# Patient Record
Sex: Male | Born: 1960 | Race: Black or African American | Hispanic: No | Marital: Married | Smoking: Never smoker
Health system: Southern US, Community
[De-identification: ages and names within clinical notes are randomized; demographics above are authoritative.]

## PROBLEM LIST (undated history)

## (undated) DIAGNOSIS — I639 Cerebral infarction, unspecified: Secondary | ICD-10-CM

## (undated) DIAGNOSIS — R011 Cardiac murmur, unspecified: Secondary | ICD-10-CM

## (undated) DIAGNOSIS — C8410 Sezary disease, unspecified site: Secondary | ICD-10-CM

## (undated) DIAGNOSIS — R9089 Other abnormal findings on diagnostic imaging of central nervous system: Secondary | ICD-10-CM

## (undated) DIAGNOSIS — Z9289 Personal history of other medical treatment: Secondary | ICD-10-CM

## (undated) DIAGNOSIS — J309 Allergic rhinitis, unspecified: Secondary | ICD-10-CM

## (undated) DIAGNOSIS — E785 Hyperlipidemia, unspecified: Secondary | ICD-10-CM

## (undated) DIAGNOSIS — G459 Transient cerebral ischemic attack, unspecified: Secondary | ICD-10-CM

## (undated) DIAGNOSIS — T7840XA Allergy, unspecified, initial encounter: Secondary | ICD-10-CM

## (undated) DIAGNOSIS — C8400 Mycosis fungoides, unspecified site: Secondary | ICD-10-CM

## (undated) HISTORY — DX: Allergy, unspecified, initial encounter: T78.40XA

## (undated) HISTORY — DX: Hyperlipidemia, unspecified: E78.5

## (undated) HISTORY — DX: Transient cerebral ischemic attack, unspecified: G45.9

## (undated) HISTORY — DX: Cerebral infarction, unspecified: I63.9

## (undated) HISTORY — DX: Sezary disease, unspecified site: C84.10

## (undated) HISTORY — DX: Other abnormal findings on diagnostic imaging of central nervous system: R90.89

## (undated) HISTORY — DX: Personal history of other medical treatment: Z92.89

## (undated) HISTORY — PX: WISDOM TOOTH EXTRACTION: SHX21

## (undated) HISTORY — DX: Mycosis fungoides, unspecified site: C84.00

---

## 1993-06-19 DIAGNOSIS — J0300 Acute streptococcal tonsillitis, unspecified: Secondary | ICD-10-CM | POA: Insufficient documentation

## 1996-09-19 DIAGNOSIS — J069 Acute upper respiratory infection, unspecified: Secondary | ICD-10-CM | POA: Insufficient documentation

## 2006-11-28 ENCOUNTER — Ambulatory Visit: Payer: Self-pay | Admitting: Family Medicine

## 2007-01-01 ENCOUNTER — Ambulatory Visit: Payer: Self-pay | Admitting: Family Medicine

## 2007-02-05 ENCOUNTER — Ambulatory Visit: Payer: Self-pay | Admitting: Family Medicine

## 2009-09-05 ENCOUNTER — Ambulatory Visit: Payer: Self-pay | Admitting: Family Medicine

## 2009-10-04 ENCOUNTER — Ambulatory Visit: Payer: Self-pay | Admitting: Family Medicine

## 2010-12-20 ENCOUNTER — Encounter: Payer: Self-pay | Admitting: Family Medicine

## 2010-12-20 DIAGNOSIS — J069 Acute upper respiratory infection, unspecified: Secondary | ICD-10-CM

## 2010-12-20 DIAGNOSIS — J029 Acute pharyngitis, unspecified: Secondary | ICD-10-CM

## 2010-12-20 DIAGNOSIS — J0300 Acute streptococcal tonsillitis, unspecified: Secondary | ICD-10-CM

## 2011-09-09 ENCOUNTER — Ambulatory Visit (INDEPENDENT_AMBULATORY_CARE_PROVIDER_SITE_OTHER): Payer: BC Managed Care – PPO | Admitting: Family Medicine

## 2011-09-09 VITALS — BP 120/80 | HR 96 | Ht 68.0 in | Wt 182.0 lb

## 2011-09-09 DIAGNOSIS — M461 Sacroiliitis, not elsewhere classified: Secondary | ICD-10-CM

## 2011-09-09 NOTE — Progress Notes (Signed)
  Subjective:    Patient ID: Hayden Huang, male    DOB: 11-Oct-1960, 51 y.o.   MRN: [id number]  HPI He complains of a two-month history of intermittent right low back pain that started after he did some wood splitting with a splinter. He has been using ibuprofen and heating pads intermittently. Radiation of pain down the right leg but no weakness, numbness or tingling.   Review of Systems     Objective:   Physical Exam Alert and in no distress. Slight tenderness to palpation over the right upper SI joint area. Good motion of his back. No tenderness to palpation over the vertebral column. Tillman Sers stork test was negative. Negative straight leg raising. Normal hip motion.       Assessment & Plan:  History is consistent with sacroiliitis Recommend heat, stretching exercises as well as Advil 800 3 times a day. Also discussed proper posturing sitting lifting and standing with him. Also discussed also referral to chiropractic or physical therapy.

## 2011-09-09 NOTE — Patient Instructions (Signed)
Heat to your back for 20 minutes 3 times a day. Take 4 Advil 3 times per day. Do this for the next 10-14 days. Accu-Chek done using the heat do the stretching exercises that I showed you.

## 2011-10-31 ENCOUNTER — Encounter: Payer: Self-pay | Admitting: Medical

## 2011-10-31 ENCOUNTER — Ambulatory Visit (INDEPENDENT_AMBULATORY_CARE_PROVIDER_SITE_OTHER): Payer: BC Managed Care – PPO | Admitting: Medical

## 2011-10-31 DIAGNOSIS — J309 Allergic rhinitis, unspecified: Secondary | ICD-10-CM

## 2011-10-31 DIAGNOSIS — J329 Chronic sinusitis, unspecified: Secondary | ICD-10-CM

## 2011-10-31 MED ORDER — BECLOMETHASONE DIPROPIONATE POWD: 2.0000 | Freq: Every day | Status: DC

## 2011-10-31 MED ORDER — AMOXICILLIN 875 MG PO TABS: 875.0000 mg | ORAL_TABLET | Freq: Two times a day (BID) | ORAL | Status: AC

## 2011-10-31 NOTE — Progress Notes (Signed)
Subjective:  Hayden Huang is a 51 y.o. male who presents for 5 day hx/o nasal congestion.  He notes that he normally gets allergy flare ups in the spring, and feels like this is his allergies currently giving him problems.  He reports nasal congestion, itchy eyes, sneezing, and using Sudafed and Neti Pot.  Sudafed is drying him out too much.  He denies sore throat, clough, ear pain, fever, chills, nausea, or vomiting.  No sick contacts.  He does note hx/o sinus infection in the past.  Patient is a non-smoker.  No other aggravating or relieving factors.  No other c/o.  Past Medical History  Diagnosis Date  . Allergy   . Dyslipidemia   . Sezary syndrome    ROS as noted in HPI   Objective:   Filed Vitals:   10/31/11 1615  BP: 130/90  Pulse: 97  Temp: 98.3 F (36.8 C)  Resp: 14    General appearance: Alert, WD/WN, no distress                             Skin: warm, no rash                           Head: no sinus tenderness,                            Eyes: conjunctiva normal, corneas clear, PERRLA                            Ears: TMs mildly retracted, external ear canals normal                          Nose: septum midline, turbinates swollen, with erythema and clear/purulent discharge             Mouth/throat: MMM, tongue normal, mild pharyngeal erythema                           Neck: supple, no adenopathy, no thyromegaly, nontender                          Heart: RRR, normal S1, S2, no murmurs                         Lungs: CTA bilaterally, no wheezes, rales, or rhonchi      Assessment and Plan:   Encounter Diagnoses  Name Primary?  . Allergic rhinitis Yes  . Sinusitis    Allergic rhinitis - begin Zyrtec 10mg  QHS, c/t Neti pot, sample for Qnasal nasal spry, and if worse over weekend with fever, sinus pressure, and thick yellow /green mucous drainage persistent, then can begin Amoxicillin for sinusitis.   Exam suggests possible early sinusitis, but he has underlying  allergic rhinitis as well, and will try to improve his treatment for that.  Call or return if worse or not improving in 2-3 days.

## 2011-11-08 ENCOUNTER — Telehealth: Payer: Self-pay | Admitting: Internal Medicine

## 2011-11-08 ENCOUNTER — Other Ambulatory Visit: Payer: Self-pay | Admitting: Medical

## 2011-11-08 MED ORDER — BECLOMETHASONE DIPROP MONOHYD 42 MCG/SPRAY NA SUSP: NASAL | Status: DC

## 2011-11-08 NOTE — Telephone Encounter (Signed)
Notified pt of rx sent and coupon code

## 2011-11-08 NOTE — Telephone Encounter (Signed)
rx sent, make sure he gives them coupon or ask pharmacy about coupon code for this.  It may require prior auth but we'll see.

## 2011-12-04 ENCOUNTER — Encounter: Payer: Self-pay | Admitting: *Deleted

## 2012-05-19 ENCOUNTER — Ambulatory Visit (INDEPENDENT_AMBULATORY_CARE_PROVIDER_SITE_OTHER): Payer: BC Managed Care – PPO | Admitting: Medical

## 2012-05-19 ENCOUNTER — Encounter: Payer: Self-pay | Admitting: Medical

## 2012-05-19 VITALS — BP 140/90 | HR 68 | Temp 98.3°F | Resp 16 | Wt 184.0 lb

## 2012-05-19 DIAGNOSIS — M5430 Sciatica, unspecified side: Secondary | ICD-10-CM

## 2012-05-19 DIAGNOSIS — M5431 Sciatica, right side: Secondary | ICD-10-CM

## 2012-05-19 DIAGNOSIS — M549 Dorsalgia, unspecified: Secondary | ICD-10-CM

## 2012-05-19 MED ORDER — METHYLPREDNISOLONE 4 MG PO KIT: PACK | ORAL | Status: DC

## 2012-05-19 NOTE — Progress Notes (Signed)
Subjective: Here for c/o right leg numbness and pain in low back, mostly the leg ache.  Been having trouble with this for months now, but its worsening and not improving.  Works on Theatre stage manager at [employer], and on his feet standing on cement all day.  Denies trauma or injury to leg or back.  Denies hx/o back issues.  Has sensation of leg numbness down right leg, but mostly back of the leg. Sometimes leg feels tingling and sometime weakness, worse in mornings when getting out of the bed.  Has an ache in the low back, but not like the leg pain.  Tried aleve, ibuprofen, some stretching.  Doesn't exercise other than activity at work.  No other aggravating or relieving factors.  No other c/o.  Past Medical History  Diagnosis Date  . Allergy   . Dyslipidemia   . Sezary syndrome   . Mycosis fungoides lymphoma    Review of Systems Constitutional: -fever, -chills, -sweats, -unexpected -weight change,-fatigue Cardiology:  -chest pain, -palpitations, -edema Respiratory: -cough, -shortness of breath, -wheezing Gastroenterology: -abdominal pain, -nausea, -vomiting, -diarrhea, -constipation Hematology: -bleeding or bruising problems Musculoskeletal: some hand swelling Ophthalmology: -vision changes Urology: -dysuria, -difficulty urinating, -hematuria, -urinary frequency, -urgency Neurology: -headache  Objective: Gen: wd, wn, nad Skin: unremarkle, no nail findings abnormal Back: mild tenderness in right low lumbar region, sciatica region and slight tenderness over right SI area, ROM about 90% of normal but with mild pain with ROM, no scoliosis MSK: mild tenderness over right buttock and to some extent posterolateral thigh, but otherwise normal ROM,nontender, no obvious deformity Neuro: normal LE sensation, DTRs, and strength, - SLR    Assessment: Encounter Diagnoses  Name Primary?  . Back pain Yes  . Sciatica of right side    Plan: discussed symptoms, findings, and will treat with Medrol dosepack  for sciatica.  Advised he take a brief walk and do stretching routine as discussed prior to work every day and see if this doesn't resolve.  Recheck 2wk.

## 2012-05-26 ENCOUNTER — Encounter: Payer: Self-pay | Admitting: Internal Medicine

## 2012-06-03 ENCOUNTER — Ambulatory Visit (INDEPENDENT_AMBULATORY_CARE_PROVIDER_SITE_OTHER): Payer: BC Managed Care – PPO | Admitting: Medical

## 2012-06-03 ENCOUNTER — Encounter: Payer: Self-pay | Admitting: Medical

## 2012-06-03 VITALS — BP 120/80 | HR 68 | Temp 98.1°F | Resp 16 | Wt 184.0 lb

## 2012-06-03 DIAGNOSIS — M549 Dorsalgia, unspecified: Secondary | ICD-10-CM

## 2012-06-03 DIAGNOSIS — M25551 Pain in right hip: Secondary | ICD-10-CM

## 2012-06-03 DIAGNOSIS — M25559 Pain in unspecified hip: Secondary | ICD-10-CM

## 2012-06-03 DIAGNOSIS — M5430 Sciatica, unspecified side: Secondary | ICD-10-CM

## 2012-06-03 MED ORDER — CYCLOBENZAPRINE HCL 10 MG PO TABS: ORAL_TABLET | ORAL | Status: DC

## 2012-06-03 MED ORDER — DICLOFENAC SODIUM 75 MG PO TBEC: 75.0000 mg | DELAYED_RELEASE_TABLET | Freq: Two times a day (BID) | ORAL | Status: DC

## 2012-06-03 NOTE — Progress Notes (Signed)
Subjective: Here for c/o right leg numbness and pain in low back, mostly the leg ache.  Been having trouble with this for months now, but its worsening and not improving.  Works on Theatre stage manager at [employer], and on his feet standing on cement all day.  Denies trauma or injury to leg or back.  Denies hx/o back issues.  Has sensation of leg numbness down right leg, but mostly back of the leg. Sometimes leg feels tingling and sometime weakness, worse in mornings when getting out of the bed.  Has an ache in the low back, but not like the leg pain.  Tried aleve, ibuprofen, some stretching.  Doesn't exercise other than activity at work.  No other aggravating or relieving factors.  No other c/o.  Past Medical History  Diagnosis Date  . Allergy   . Dyslipidemia   . Sezary syndrome   . Mycosis fungoides lymphoma   . Sezary syndrome    Review of Systems Constitutional: -fever, -chills, -sweats, -unexpected -weight change,-fatigue Cardiology:  -chest pain, -palpitations, -edema Respiratory: -cough, -shortness of breath, -wheezing Gastroenterology: -abdominal pain, -nausea, -vomiting, -diarrhea, -constipation Hematology: -bleeding or bruising problems Musculoskeletal: some hand swelling Ophthalmology: -vision changes Urology: -dysuria, -difficulty urinating, -hematuria, -urinary frequency, -urgency Neurology: -headache  Objective: Gen: wd, wn, nad Skin: unremarkable, no nail findings abnormal Back: mild tenderness in right low lumbar region, sciatica region and slight tenderness over right SI area, ROM about 90% of normal but with mild pain with ROM, no scoliosis MSK: mild tenderness over right buttock and to some extent posterolateral thigh, right hip ROM decreased internal ROM, but otherwise normal ROM,nontender, no obvious deformity Neuro: normal LE sensation, DTRs, and strength, - SLR, normal heel and toe walk    Assessment: Encounter Diagnoses  Name Primary?  . Back pain Yes  . Sciatica     . Hip pain, right    Referral to PT.  Will get hip and L spine xrays.  Scripts today for flexeril and Voltaren.   F/u in 2-4 wk.

## 2012-06-05 ENCOUNTER — Telehealth: Payer: Self-pay | Admitting: Family Medicine

## 2012-06-05 NOTE — Telephone Encounter (Signed)
Message copied by Janeice Robinson on Fri Jun 05, 2012 11:54 AM ------      Message from: Aleen Campi, DAVID S      Created: Wed Jun 03, 2012  8:47 PM       Refer to PT.             Also, ask patient what is the status of his sezary syndrome ( a type of cancerous skin findings/lymphoma)?  Is he still getting treatment, does he have routine f/u with [facility]?            In light of this history, lets go ahead and get xrays of L spine and right hip along with the referral.

## 2012-06-05 NOTE — Telephone Encounter (Signed)
PATIENT STATES THAT HE IS GETTING IS TREATMENT FOR THE CANCER AND HE HAS HIS F/U APPOINTMENT'S FOR THAT AS WELL. CLS   PATIENT IS AWARE OF HIS APPOINTMENT WITH [facility] ON 06/11/12 @ 6OO PM. CLS  [facility] [phone]   PATIENT IS ALSO AWARE OF THE L-SPINE AND RIGHT HIP X-RAYS. ORDER'S ARE FAX OVER TO [facility]. CLS

## 2012-06-08 ENCOUNTER — Ambulatory Visit
Admission: RE | Admit: 2012-06-08 | Discharge: 2012-06-08 | Disposition: A | Discharge status: Home / Self Care | Payer: BC Managed Care – PPO | Source: Ambulatory Visit | Attending: Medical | Admitting: Medical

## 2012-06-08 DIAGNOSIS — M25551 Pain in right hip: Secondary | ICD-10-CM

## 2012-06-08 DIAGNOSIS — M5430 Sciatica, unspecified side: Secondary | ICD-10-CM

## 2012-06-08 DIAGNOSIS — M549 Dorsalgia, unspecified: Secondary | ICD-10-CM

## 2013-01-02 ENCOUNTER — Encounter (HOSPITAL_BASED_OUTPATIENT_CLINIC_OR_DEPARTMENT_OTHER): Payer: Self-pay | Admitting: Emergency Medicine

## 2013-01-02 ENCOUNTER — Emergency Department (HOSPITAL_BASED_OUTPATIENT_CLINIC_OR_DEPARTMENT_OTHER)
Admission: EM | Admit: 2013-01-02 | Discharge: 2013-01-02 | Disposition: A | Discharge status: Home / Self Care | Payer: Worker's Compensation | Attending: Emergency Medicine | Admitting: Emergency Medicine

## 2013-01-02 DIAGNOSIS — Y990 Civilian activity done for income or pay: Secondary | ICD-10-CM | POA: Insufficient documentation

## 2013-01-02 DIAGNOSIS — W868XXA Exposure to other electric current, initial encounter: Secondary | ICD-10-CM | POA: Insufficient documentation

## 2013-01-02 DIAGNOSIS — Z8639 Personal history of other endocrine, nutritional and metabolic disease: Secondary | ICD-10-CM | POA: Insufficient documentation

## 2013-01-02 DIAGNOSIS — Z862 Personal history of diseases of the blood and blood-forming organs and certain disorders involving the immune mechanism: Secondary | ICD-10-CM | POA: Insufficient documentation

## 2013-01-02 DIAGNOSIS — T754XXA Electrocution, initial encounter: Secondary | ICD-10-CM | POA: Insufficient documentation

## 2013-01-02 DIAGNOSIS — Z87898 Personal history of other specified conditions: Secondary | ICD-10-CM | POA: Insufficient documentation

## 2013-01-02 DIAGNOSIS — Y9289 Other specified places as the place of occurrence of the external cause: Secondary | ICD-10-CM | POA: Insufficient documentation

## 2013-01-02 DIAGNOSIS — Y9389 Activity, other specified: Secondary | ICD-10-CM | POA: Insufficient documentation

## 2013-01-02 NOTE — ED Notes (Signed)
Pt was using machine at work and received shock.  Pt felt slight tingle to right arm.  Asymptomatic currently.  No LOC or severe side effects at time of shock.

## 2013-01-02 NOTE — ED Provider Notes (Signed)
History    This chart was scribed for Charles B. Bernette Mayers, MD by Quintella Reichert, ED scribe.  This patient was seen in room MH01/MH01 and the patient's care was started at 3:28 PM.   CSN: [id number]  Arrival date & time 01/02/13  1411      Chief Complaint  Patient presents with  . Electric Shock     The history is provided by the patient. No language interpreter was used.    HPI Comments: Hayden Huang is a 52 y.o. male who presents to the Emergency Department complaining of an electric shock that occurred several hours ago.  Pt states that he received the shock due to an electrical malfunction, when he touched a chain-horse at work with his right hand and felt a shock traveling down that arm.  He states he was wearing static-dissipator shoes and rubber gloves at the time of the shock.  Pt denies LOC, subsequent pain to any areas, CP, palpitations, or any other associated symptoms.  He was advised to come to the ED by employers.  Pt has h/o lymphoma and sezary syndrome.   Past Medical History  Diagnosis Date  . Allergy   . Dyslipidemia   . Sezary syndrome   . Mycosis fungoides lymphoma   . Sezary syndrome     History reviewed. No pertinent past surgical history.  History reviewed. No pertinent family history.  History  Substance Use Topics  . Smoking status: Never Smoker   . Smokeless tobacco: Never Used  . Alcohol Use: Not on file      Review of Systems A complete 10 system review of systems was obtained and all systems are negative except as noted in the HPI and PMH.    Allergies  Review of patient's allergies indicates no known allergies.  Home Medications  No current outpatient prescriptions on file.  BP 145/94  Pulse 75  Temp(Src) 98.2 F (36.8 C) (Oral)  Resp 16  Ht 5\' 6"  (1.676 m)  Wt 189 lb (85.73 kg)  BMI 30.52 kg/m2  SpO2 98%  Physical Exam  Nursing note and vitals reviewed. Constitutional: He is oriented to person, place, and time. He  appears well-developed and well-nourished.  HENT:  Head: Normocephalic and atraumatic.  Eyes: EOM are normal. Pupils are equal, round, and reactive to light.  Neck: Normal range of motion. Neck supple.  Cardiovascular: Normal rate, normal heart sounds and intact distal pulses.   Pulmonary/Chest: Effort normal and breath sounds normal.  Abdominal: Bowel sounds are normal. He exhibits no distension. There is no tenderness.  Musculoskeletal: Normal range of motion. He exhibits no edema and no tenderness.  Neurological: He is alert and oriented to person, place, and time. He has normal strength. No cranial nerve deficit or sensory deficit.  Skin: Skin is warm and dry. No rash noted.  Psychiatric: He has a normal mood and affect.    ED Course  Procedures (including critical care time)  DIAGNOSTIC STUDIES: Oxygen Saturation is 98% on room air, normal by my interpretation.    COORDINATION OF CARE: 3:31 PM-Explained that no treatment is necessary.  Pt agreed to plan. Pt declines a drug screen urinalysis for work.     Labs Reviewed - No data to display No results found.   1. Electric shock, initial encounter       MDM  Mild electrical shock without significant injury or concern for occult organ damage.        I personally performed the services described  in this documentation, which was scribed in my presence. The recorded information has been reviewed and is accurate.     Charles B. Bernette Mayers, MD 01/02/13 1540

## 2013-02-16 DIAGNOSIS — Z9289 Personal history of other medical treatment: Secondary | ICD-10-CM

## 2013-02-16 DIAGNOSIS — G459 Transient cerebral ischemic attack, unspecified: Secondary | ICD-10-CM

## 2013-02-16 HISTORY — DX: Transient cerebral ischemic attack, unspecified: G45.9

## 2013-02-16 HISTORY — DX: Personal history of other medical treatment: Z92.89

## 2013-02-23 ENCOUNTER — Encounter (HOSPITAL_COMMUNITY): Payer: Self-pay | Admitting: *Deleted

## 2013-02-23 ENCOUNTER — Observation Stay (HOSPITAL_COMMUNITY)
Admission: EM | Admit: 2013-02-23 | Discharge: 2013-02-24 | Disposition: A | Discharge status: Home / Self Care | Payer: BC Managed Care – PPO | Attending: Internal Medicine | Admitting: Internal Medicine

## 2013-02-23 ENCOUNTER — Observation Stay (HOSPITAL_COMMUNITY): Payer: BC Managed Care – PPO

## 2013-02-23 ENCOUNTER — Emergency Department (HOSPITAL_COMMUNITY): Payer: BC Managed Care – PPO

## 2013-02-23 DIAGNOSIS — J309 Allergic rhinitis, unspecified: Secondary | ICD-10-CM | POA: Diagnosis present

## 2013-02-23 DIAGNOSIS — G930 Cerebral cysts: Secondary | ICD-10-CM

## 2013-02-23 DIAGNOSIS — E785 Hyperlipidemia, unspecified: Secondary | ICD-10-CM | POA: Diagnosis present

## 2013-02-23 DIAGNOSIS — R9089 Other abnormal findings on diagnostic imaging of central nervous system: Secondary | ICD-10-CM

## 2013-02-23 DIAGNOSIS — C8400 Mycosis fungoides, unspecified site: Secondary | ICD-10-CM | POA: Diagnosis present

## 2013-02-23 DIAGNOSIS — G459 Transient cerebral ischemic attack, unspecified: Principal | ICD-10-CM | POA: Diagnosis present

## 2013-02-23 DIAGNOSIS — I517 Cardiomegaly: Secondary | ICD-10-CM

## 2013-02-23 DIAGNOSIS — C8409 Mycosis fungoides, extranodal and solid organ sites: Secondary | ICD-10-CM | POA: Insufficient documentation

## 2013-02-23 DIAGNOSIS — Z9289 Personal history of other medical treatment: Secondary | ICD-10-CM

## 2013-02-23 DIAGNOSIS — R4789 Other speech disturbances: Secondary | ICD-10-CM | POA: Insufficient documentation

## 2013-02-23 HISTORY — DX: Personal history of other medical treatment: Z92.89

## 2013-02-23 HISTORY — DX: Allergic rhinitis, unspecified: J30.9

## 2013-02-23 HISTORY — DX: Other abnormal findings on diagnostic imaging of central nervous system: R90.89

## 2013-02-23 HISTORY — DX: Cardiac murmur, unspecified: R01.1

## 2013-02-23 LAB — URINALYSIS, ROUTINE W REFLEX MICROSCOPIC
Bilirubin Urine: NEGATIVE
Glucose, UA: NEGATIVE mg/dL
Hgb urine dipstick: NEGATIVE
Ketones, ur: NEGATIVE mg/dL
Leukocytes, UA: NEGATIVE
pH: 7.5 (ref 5.0–8.0)

## 2013-02-23 LAB — TROPONIN I: Troponin I: <0.3 ng/mL (ref <0.30)

## 2013-02-23 LAB — CBC
Hemoglobin: 16.5 g/dL (ref 13.0–17.0)
MCH: 32.1 pg (ref 26.0–34.0)
MCH: 31.9 pg (ref 26.0–34.0)
MCHC: 35.8 g/dL (ref 30.0–36.0)
MCHC: 35.4 g/dL (ref 30.0–36.0)
MCV: 89.7 fL (ref 78.0–100.0)
MCV: 90 fL (ref 78.0–100.0)
Platelets: 193 10*3/uL (ref 150–400)
Platelets: 192 10*3/uL (ref 150–400)
RBC: 5.14 MIL/uL (ref 4.22–5.81)
RBC: 5.21 MIL/uL (ref 4.22–5.81)

## 2013-02-23 LAB — COMPREHENSIVE METABOLIC PANEL
Albumin: 4 g/dL (ref 3.5–5.2)
BUN: 13 mg/dL (ref 6–23)
Calcium: 9.5 mg/dL (ref 8.4–10.5)
GFR calc Af Amer: >90 mL/min (ref >90)
Glucose, Bld: 118 mg/dL — ABNORMAL HIGH (ref 70–99)
Total Protein: 7.5 g/dL (ref 6.0–8.3)

## 2013-02-23 LAB — DIFFERENTIAL
Basophils Relative: 0 % (ref 0–1)
Eosinophils Absolute: 0.1 10*3/uL (ref 0.0–0.7)
Eosinophils Relative: 2 % (ref 0–5)
Lymphs Abs: 1.5 10*3/uL (ref 0.7–4.0)
Monocytes Relative: 10 % (ref 3–12)

## 2013-02-23 LAB — RAPID URINE DRUG SCREEN, HOSP PERFORMED
Benzodiazepines: NOT DETECTED
Cocaine: NOT DETECTED
Opiates: NOT DETECTED

## 2013-02-23 LAB — GLUCOSE, CAPILLARY: Glucose-Capillary: 115 mg/dL — ABNORMAL HIGH (ref 70–99)

## 2013-02-23 LAB — POCT I-STAT, CHEM 8
BUN: 14 mg/dL (ref 6–23)
Calcium, Ion: 1.16 mmol/L (ref 1.12–1.23)
Chloride: 105 mEq/L (ref 96–112)

## 2013-02-23 LAB — POCT I-STAT TROPONIN I

## 2013-02-23 LAB — PROTIME-INR
INR: 0.91 (ref 0.00–1.49)
Prothrombin Time: 12.1 seconds (ref 11.6–15.2)

## 2013-02-23 MED ORDER — ENOXAPARIN SODIUM 40 MG/0.4ML ~~LOC~~ SOLN
40.0000 mg | SUBCUTANEOUS | Status: DC
  Administered 2013-02-23: 40 mg via SUBCUTANEOUS
  Filled 2013-02-23 (×2): qty 0.4

## 2013-02-23 MED ORDER — ASPIRIN 300 MG RE SUPP
300.0000 mg | Freq: Every day | RECTAL | Status: DC
  Filled 2013-02-23 (×2): qty 1

## 2013-02-23 MED ORDER — SENNOSIDES-DOCUSATE SODIUM 8.6-50 MG PO TABS: 1.0000 | ORAL_TABLET | Freq: Every evening | ORAL | Status: DC | PRN

## 2013-02-23 MED ORDER — SODIUM CHLORIDE 0.9 % IV SOLN
INTRAVENOUS | Status: AC
  Administered 2013-02-23: 13:00:00 via INTRAVENOUS

## 2013-02-23 MED ORDER — ASPIRIN 325 MG PO TABS
325.0000 mg | ORAL_TABLET | Freq: Every day | ORAL | Status: DC
  Administered 2013-02-23 – 2013-02-24 (×2): 325 mg via ORAL
  Filled 2013-02-23 (×2): qty 1

## 2013-02-23 NOTE — ED Notes (Signed)
Urine sample requested.  Urinal placed at bedside.

## 2013-02-23 NOTE — Progress Notes (Signed)
Echo Lab  2D Echocardiogram completed.  Yazhini Mcaulay L Val Schiavo, RDCS 02/23/2013 3:53 PM

## 2013-02-23 NOTE — ED Notes (Signed)
Pt states got up and went to work and then went to get coffee and felt like he could not get his words out.  Pt states it lasted for just a couple of minutes and resolved.  Pt just feels weak all over now.  No extremity deficits,  No chest pain or sob

## 2013-02-23 NOTE — ED Notes (Signed)
Neurology at bedside.

## 2013-02-23 NOTE — Procedures (Signed)
ELECTROENCEPHALOGRAM REPORT   Patient: Hayden Huang       Room #: 4U98 EEG No. ID:  Age: 52 y.o.        Sex: male Referring Physician: Short Report Date:  02/23/2013        Interpreting Physician: Aline Brochure  History: TAKASHI KOROL is an 52 y.o. male who was admitted following an episode of transient speech output difficulty and lightheadedness. MRI showed small right middle cranial fossa arachnoid cyst.    Indications for study:  Rule out new onset partial seizure disorder.  Technique: This is an 18 channel routine scalp EEG performed at the bedside with bipolar and monopolar montages arranged in accordance to the international 10/20 system of electrode placement.   Description: EEG was recorded during wakefulness and during sleep. Predominant background activity during wakefulness consisted of 10 Hz symmetrical alpha rhythm which attenuates well with eye-opening. Photic stimulation produced a symmetric occipital driving response. Hyperventilation produced normal transient generalized slowing response. During stage II sleep symmetrical vertex waves, sleep spindles and K-complexes recorded. No epileptiform discharges were recorded during wakefulness nor during sleep. There was no abnormal slowing.  Interpretation: This is a normal EEG recording during wakefulness and during sleep. No evidence of an epileptic disorder was demonstrated.   Venetia Maxon M.D. [facility] [phone]

## 2013-02-23 NOTE — Progress Notes (Signed)
*  PRELIMINARY RESULTS* Vascular Ultrasound Carotid Duplex (Doppler) has been completed.   There is no obvious evidence of hemodynamically significant internal carotid artery stenosis bilaterally. Vertebral arteries are patent with antegrade flow.  02/23/2013 4:15 PM Gertie Fey, RVT, RDCS, RDMS

## 2013-02-23 NOTE — ED Provider Notes (Signed)
History    CSN: [id number] Arrival date & time 02/23/13  7829  First MD Initiated Contact with Patient 02/23/13 [phone]     Chief Complaint  Patient presents with  . Stroke Symptoms   (Consider location/radiation/quality/duration/timing/severity/associated sxs/prior Treatment) HPI Comments: Patient is a 52 year old man who felt lightheaded this morning when getting breakfast at a caf around 5:45 A.M. He had trouble speaking and getting words out. This episode lasted about 5 minutes. He felt generally weak, there was no paralysis and no numbness. He has had no prior similar episodes.  Patient is a 52 y.o. male presenting with Acute Neurological Problem. The history is provided by the patient and medical records. No language interpreter was used.  Cerebrovascular Accident This is a new problem. The current episode started 1 to 2 hours ago. Episode frequency: A brief episode lasting approximately 5 minutes. The problem has been resolved. Pertinent negatives include no chest pain, no abdominal pain, no headaches and no shortness of breath. Nothing aggravates the symptoms. Nothing relieves the symptoms. He has tried nothing for the symptoms.   Past Medical History  Diagnosis Date  . Allergy   . Dyslipidemia   . Sezary syndrome   . Mycosis fungoides lymphoma   . Sezary syndrome    No past surgical history on file. No family history on file. History  Substance Use Topics  . Smoking status: Never Smoker   . Smokeless tobacco: Never Used  . Alcohol Use: Not on file    Review of Systems  Constitutional: Negative for fever.  HENT: Negative.   Eyes: Negative.  Negative for visual disturbance.  Respiratory: Negative.  Negative for shortness of breath.   Cardiovascular: Negative for chest pain.  Gastrointestinal: Negative.  Negative for abdominal pain.  Genitourinary: Negative.   Musculoskeletal: Negative.   Skin:       He has known skin cancer on his right leg.  Neurological: Positive  for speech difficulty, weakness and light-headedness. Negative for headaches.  Psychiatric/Behavioral: Negative.     Allergies  Review of patient's allergies indicates no known allergies.  Home Medications  No current outpatient prescriptions on file. BP 168/96  Pulse 90  Temp(Src) 98.6 F (37 C)  Resp 18  Wt 185 lb (83.915 kg)  BMI 29.87 kg/m2  SpO2 99% Physical Exam  Nursing note and vitals reviewed. Constitutional: He is oriented to person, place, and time.  BP 168/96.  Patient is well appearing, in no distress.  HENT:  Head: Normocephalic and atraumatic.  Right Ear: External ear normal.  Left Ear: External ear normal.  Mouth/Throat: Oropharynx is clear and moist.  Eyes: Conjunctivae and EOM are normal. Pupils are equal, round, and reactive to light. No scleral icterus.  Neck: Normal range of motion. Neck supple.  No carotid bruit.  Cardiovascular: Normal rate, regular rhythm and normal heart sounds.   Pulmonary/Chest: Effort normal and breath sounds normal.  Abdominal: Soft. Bowel sounds are normal.  Musculoskeletal: Normal range of motion. He exhibits no edema and no tenderness.  Neurological: He is alert and oriented to person, place, and time. He has normal reflexes.  No sensory or motor deficit  Skin: Skin is warm and dry.  Psychiatric: He has a normal mood and affect. His behavior is normal.    ED Course  Procedures (including critical care time) Labs Reviewed  ETHANOL  PROTIME-INR  APTT  CBC  DIFFERENTIAL  COMPREHENSIVE METABOLIC PANEL  TROPONIN I  URINE RAPID DRUG SCREEN (HOSP PERFORMED)  URINALYSIS, ROUTINE W  REFLEX MICROSCOPIC   8:05 AM Patient was seen and had physical examination. Stroke workup was initiated. Neurology consult was requested.  8:09 AM  Date: 02/23/2013  Rate: 90  Rhythm: normal sinus rhythm  QRS Axis: normal  Intervals: normal  ST/T Wave abnormalities: normal  Conduction Disutrbances:none  Narrative Interpretation: Normal  EKG  Old EKG Reviewed: none available  8:28 AM Case discussed with Noel Christmas, M.D., neurologist.  He will consult on pt.  9:13 AM Pt seen by Neurology, who have recommended stroke workup.  MRA, MRI of brain ordered.  9:37 AM Case discussed with Dr. Malachi Bonds --> admit to neuro telemetry unit.  1. TIA (transient ischemic attack)         Carleene Cooper III, MD 02/23/13 [phone]

## 2013-02-23 NOTE — Progress Notes (Signed)
EEG Completed; Results Pending  

## 2013-02-23 NOTE — ED Notes (Signed)
PT to CT.

## 2013-02-23 NOTE — H&P (Signed)
[facility] History and Physical  Hayden Huang EAV:[id number] DOB: 1961/03/18 DOA: 02/23/2013  Referring physician:  Carleene Cooper III PCP:  Ernst Breach, PA-C   Chief Complaint:  Difficulty speaking  HPI:  The patient is a 52 y.o. year-old male with history of Myocosis fungoidees lymphoma (cutaneous T-cell lymphoma) on topical mustard ointment Rx at [facility], dyslipidemia, and allergic rhinitis who presents with difficulty speaking.  The patient was last at their baseline health around 5:45 AM.  The patient states that for the last several weeks he felt well except for a small colocolostomy. This morning around 5:45 AM he was speaking with a colleague in summary he had difficulty getting the words out. He denied any difficulty moving his face or his tongue. He denies focal numbness, tingling, weakness. He was sitting down during the episode and did not attempt to stand or walk. His speech was affected for approximately 2-5 minutes and resolved on its own.  He states nothing like this is her happen before so he came immediately to the emergency department. He denied any shaking or jerking movements, loss of bowel or bladder control, or sleepiness after the episode.  In the emergency department, he was seen by neurology who recommended admission under observation and to identify risk factors for stroke. His head CT was negative.    TPA was not administered because symptoms resolved.  Review of Systems:  Denies fevers, chills, weight loss or gain, changes to hearing and vision.  Recent rhinorrhea, sinus congestion, without sore throat.  Denies chest pain and palpitations.  Denies SOB, wheezing, cough.  Denies nausea, vomiting, constipation, diarrhea.  Denies dysuria, frequency, urgency, polyuria, polydipsia.  Denies hematemesis, blood in stools, melena, abnormal bruising or bleeding.  Denies lymphadenopathy.  Denies arthralgias, myalgias.  + Thigh skin rash or ulcer.  Denies lower  extremity edema.  As above.  Denies anxiety and depression.    Past Medical History  Diagnosis Date  . Allergic rhinitis   . Dyslipidemia   . Sezary syndrome   . Mycosis fungoides lymphoma     cutaneous T-cell lymphoma  . Heart murmur    Past Surgical History  Procedure Laterality Date  . None     Social History:  reports that he has never smoked. He has never used smokeless tobacco. He reports that  drinks alcohol. He reports that he does not use illicit drugs. Lives in a house with his wife and three kids.  No stairs.  Works as an Therapist, [employer].   No Known Allergies  Family History  Problem Relation Age of Onset  . High blood pressure    . Diabetes Father   . Diabetes Brother   . Prostate cancer Paternal Grandfather   . Cancer Maternal Grandmother     HCC     Prior to Admission medications   Medication Sig Start Date End Date Taking? Authorizing Provider  beclomethasone (BECONASE-AQ) 42 MCG/SPRAY nasal spray Place 2 sprays into the nose 2 (two) times daily as needed (sinus allergies). Dose is for each nostril.   Yes Historical Provider, MD  PRESCRIPTION MEDICATION Apply 1 application topically daily. Nitrogen-Mustard compound. Apply daily to legs and twice weekly to face.   Yes Historical Provider, MD   Physical Exam: Filed Vitals:   02/23/13 0900 02/23/13 0915 02/23/13 1020 02/23/13 1130  BP: 129/87  141/94 132/95  Pulse: 74  71 60  Temp:  98.4 F (36.9 C)  98 F (36.7 C)  TempSrc:  Oral  Resp: 18  18 18   Weight:      SpO2: 97%  97% 96%     General:  Thin African American male, no acute distress  Eyes:  PERRL, anicteric, non-injected.  ENT:  Nares clear.  OP clear, non-erythematous without plaques or exudates.  MMM.  Neck:  Supple without TM or JVD.    Lymph:  No cervical, supraclavicular, or submandibular LAD.  Cardiovascular:  RRR, normal S1, S2, without m/r/g.  2+ pulses, warm extremities  Respiratory:  CTA bilaterally without increased  WOB.  Abdomen:  NABS.  Soft, ND/NT.    Skin:  No rashes or focal lesions.  Musculoskeletal:  Normal bulk and tone.  No LE edema.  Psychiatric:  A & O x 4.  Appropriate affect.  Neurologic:  CN 3-12 intact.  5/5 strength.  Sensation intact.  No dysmetria or gait instability.  Labs on Admission:  Basic Metabolic Panel:  Recent Labs Lab 02/23/13 0800 02/23/13 0831  NA 138 141  K 3.5 3.5  CL 103 105  CO2 24  --   GLUCOSE 118* 119*  BUN 13 14  CREATININE 1.01 1.00  CALCIUM 9.5  --    Liver Function Tests:  Recent Labs Lab 02/23/13 0800  AST 21  ALT 21  ALKPHOS 62  BILITOT 0.3  PROT 7.5  ALBUMIN 4.0   No results found for this basename: LIPASE, AMYLASE,  in the last 168 hours No results found for this basename: AMMONIA,  in the last 168 hours CBC:  Recent Labs Lab 02/23/13 0800 02/23/13 0831  WBC 6.3  --   NEUTROABS 4.0  --   HGB 16.5 17.7*  HCT 46.1 52.0  MCV 89.7  --   PLT 193  --    Cardiac Enzymes:  Recent Labs Lab 02/23/13 0805  TROPONINI <0.30    BNP (last 3 results) No results found for this basename: PROBNP,  in the last 8760 hours CBG:  Recent Labs Lab 02/23/13 0757  GLUCAP 115*    Radiological Exams on Admission: Ct Head Wo Contrast  02/23/2013   *RADIOLOGY REPORT*  Clinical Data: Lightheadedness.  Dyslipidemia.  CT HEAD WITHOUT CONTRAST  Technique:  Contiguous axial images were obtained from the base of the skull through the vertex without contrast.  Comparison: None.  Findings: No intracranial hemorrhage.  No hydrocephalus.  No CT evidence of large acute infarct.  Suggestion of tiny arachnoid cyst anterior middle cranial fossa otherwise no evidence of intracranial mass lesion detected on this unenhanced exam.  Minimal mucosal thickening left frontal sinus and ethmoid sinus air cells.  IMPRESSION: No intracranial hemorrhage.  No CT evidence of large acute infarct.  Suggestion of tiny arachnoid cyst anterior middle cranial fossa otherwise  no evidence of intracranial mass lesion detected on this unenhanced exam.  Minimal mucosal thickening left frontal sinus and ethmoid sinus air cells.   Original Report Authenticated By: Lacy Duverney, M.D.   Mr [facility] Wo Contrast  02/23/2013   *RADIOLOGY REPORT*  Clinical Data:  52 year old male with dizziness.  Transient episode of weakness and difficulty speaking.  Comparison: Head CT without contrast 0827 hours the same day.  MRI HEAD WITHOUT CONTRAST  Technique: Multiplanar, multiecho pulse sequences of the brain and surrounding structures were obtained according to standard protocol without intravenous contrast.  Findings: Small right anterior middle cranial fossa arachnoid cyst confirmed.  No significant mass effect on the right temporal tip.  Normal cerebral volume. No restricted diffusion to suggest acute  infarction.  No ventriculomegaly. No acute intracranial hemorrhage identified.  No other intracranial extra-axial collection or mass lesion.  No midline shift or mass effect.  Negative pituitary, cervicomedullary junction and visualized cervical spine.  Major intracranial vascular flow voids are preserved. Wallace Cullens and white matter signal is within normal limits throughout the brain. Grossly normal visualized internal auditory structures.  Visualized orbit soft tissues are within normal limits.  Mild paranasal sinus mucosal thickening on the left.  Mastoids are clear.  Small retention cysts in the nasopharynx.  Normal bone marrow signal.  Negative scalp soft tissues.  IMPRESSION: 1.  Normal noncontrast MRI appearance of the brain. Incidental small right middle cranial fossa arachnoid cyst confirmed. 2.  See MRA findings below.  MRA HEAD WITHOUT CONTRAST  Technique: Angiographic images of the Circle of Willis were obtained using MRA technique without  intravenous contrast.  Findings: Antegrade flow in the posterior circulation with codominant distal vertebral arteries.  Normal right PICA.  Dominant left AICA  tortuous but otherwise normal vertebrobasilar junction and basilar artery.  SCA and right PCA origins are normal.  Fetal type left PCA origin.  The right posterior communicating artery also is present.  Bilateral PCA branches are within normal limits.  Antegrade flow in both ICA siphons.  No ICA stenosis.  Negative left ICA siphon, with normal left ophthalmic and posterior communicating artery origins.  The right ICA siphon is remarkable for an unusually positioned origin of the right ophthalmic artery, arising laterally from the cavernous segment, see series 10 image 69.  Then in the proximal right supraclinoid ICA at the more conventional site of the ophthalmic artery origin there is a small triangular outpouching measuring up to 3 mm (series 1004 image 9).  This could be a superior hypophoseal infundibulum or small aneurysm.  Carotid termini are patent.  MCA and ACA origins are within normal limits.  Diminutive anterior communicating artery.  Visualized ACA branches are within normal limits.  Visualized bilateral MCA branches are within normal limits.  IMPRESSION: 1.  No intracranial stenosis or circle of Willis branch occlusion. 2.  Anatomic variation of the right ICA siphon and right ophthalmic artery origin.  There is also a 3 mm outpouching of the right supraclinoid ICA near the anterior genu directed medially which could represent a small superior hypophoseal infundibulum (favored) or aneurysm. At this lesion appears to be too small to treat, surveillance MRA (e.g. annual or biennial) may be most appropriate.   Original Report Authenticated By: Erskine Speed, M.D.   Mr Brain Wo Contrast  02/23/2013   *RADIOLOGY REPORT*  Clinical Data:  52 year old male with dizziness.  Transient episode of weakness and difficulty speaking.  Comparison: Head CT without contrast 0827 hours the same day.  MRI HEAD WITHOUT CONTRAST  Technique: Multiplanar, multiecho pulse sequences of the brain and surrounding structures were  obtained according to standard protocol without intravenous contrast.  Findings: Small right anterior middle cranial fossa arachnoid cyst confirmed.  No significant mass effect on the right temporal tip.  Normal cerebral volume. No restricted diffusion to suggest acute infarction.  No ventriculomegaly. No acute intracranial hemorrhage identified.  No other intracranial extra-axial collection or mass lesion.  No midline shift or mass effect.  Negative pituitary, cervicomedullary junction and visualized cervical spine.  Major intracranial vascular flow voids are preserved. Wallace Cullens and white matter signal is within normal limits throughout the brain. Grossly normal visualized internal auditory structures.  Visualized orbit soft tissues are within normal limits.  Mild paranasal sinus  mucosal thickening on the left.  Mastoids are clear.  Small retention cysts in the nasopharynx.  Normal bone marrow signal.  Negative scalp soft tissues.  IMPRESSION: 1.  Normal noncontrast MRI appearance of the brain. Incidental small right middle cranial fossa arachnoid cyst confirmed. 2.  See MRA findings below.  MRA HEAD WITHOUT CONTRAST  Technique: Angiographic images of the Circle of Willis were obtained using MRA technique without  intravenous contrast.  Findings: Antegrade flow in the posterior circulation with codominant distal vertebral arteries.  Normal right PICA.  Dominant left AICA tortuous but otherwise normal vertebrobasilar junction and basilar artery.  SCA and right PCA origins are normal.  Fetal type left PCA origin.  The right posterior communicating artery also is present.  Bilateral PCA branches are within normal limits.  Antegrade flow in both ICA siphons.  No ICA stenosis.  Negative left ICA siphon, with normal left ophthalmic and posterior communicating artery origins.  The right ICA siphon is remarkable for an unusually positioned origin of the right ophthalmic artery, arising laterally from the cavernous segment, see  series 10 image 69.  Then in the proximal right supraclinoid ICA at the more conventional site of the ophthalmic artery origin there is a small triangular outpouching measuring up to 3 mm (series 1004 image 9).  This could be a superior hypophoseal infundibulum or small aneurysm.  Carotid termini are patent.  MCA and ACA origins are within normal limits.  Diminutive anterior communicating artery.  Visualized ACA branches are within normal limits.  Visualized bilateral MCA branches are within normal limits.  IMPRESSION: 1.  No intracranial stenosis or circle of Willis branch occlusion. 2.  Anatomic variation of the right ICA siphon and right ophthalmic artery origin.  There is also a 3 mm outpouching of the right supraclinoid ICA near the anterior genu directed medially which could represent a small superior hypophoseal infundibulum (favored) or aneurysm. At this lesion appears to be too small to treat, surveillance MRA (e.g. annual or biennial) may be most appropriate.   Original Report Authenticated By: Erskine Speed, M.D.    EKG: Independently reviewed. Normal sinus rhythm  Assessment/Plan Active Problems:   TIA (transient ischemic attack)   Allergic rhinitis   Dyslipidemia   Mycosis fungoides lymphoma   Difficulty speaking, transient.  DDx included TIA, stroke, seizure, however, MRI was negative for stroke and EEG was normal.  Most likely had a TIA.  Will identify risk factors for stroke a screening for carotid stenosis hyperlipidemia, hypertension, arrhythmia, valvular abnormalities. -  Observation on telemetry -  MRI brain/MRA head complete.  Possible small 3mm aneurysm and a small arachnoid cyst incidentally found -  Carotid duplex pending -  ECHO pending -  Aspirin 325mg  daily, however, neurology recommending only 81mg  daily.  Will confer with them regarding dose. -  Lipid panel -  Hemoglobin A1c -  PT/OT/SLP  -  EEG normal -  Appreciate neurology assistance  Allergic rhinitis with  swollen turbinates -  Continue nasal steroids  Mycosis fungoides lymphoma -  Hold off on mustard cream for now and he may resume at home.    Arachnoid cyst, small.  Per neurology Possible 3mm aneurysm of the right supraclinoid ICU vs. Small superior hypophyseal infudibulum.    Diet:  Healthy heart Access:  PIV IVF:  Off Proph:  Lovenox  Code Status: Full code Family Communication: Spoke with patient and his family Disposition Plan: Observation on telemetry, likely home in one day  Time spent: 45 min  Renae Fickle [facility] Pager [phone]  If 7PM-7AM, please contact night-coverage [email or web address] Password [facility] 02/23/2013, 12:18 PM

## 2013-02-23 NOTE — Consult Note (Addendum)
Referring Physician: Ignacia Palma    Chief Complaint: transient altered speech  HPI:                                                                                                                                         Hayden Huang is an 52 y.o. male with known mycosis fungoides cutaneous T lymphoma and followed by [facility]. Patient was up at 4 am this morning and went to work.  At 5:45 he was getting a coffee and noted he "felt funny like everything was in slow motion".  A coworker asked him a question and all he could do was mumble.  He knew something was wrong at that time.  This only last for a second but then he noted he continued to feel funny and felt very tired.  At present time he feels back to his baseline. Exam shows no abnormalities. No history of seizure, no aura, no other symptoms other than noted above.    Date last known well: 7.8.14 Time last known well: 5:45 tPA Given: No: symptoms resolved.   Past Medical History  Diagnosis Date  . Allergy   . Dyslipidemia   . Sezary syndrome   . Mycosis fungoides lymphoma   . Sezary syndrome     No past surgical history on file.  Family history: Mother: HTN Father: HTN  Social History:  reports that he has never smoked. He has never used smokeless tobacco. His alcohol and drug histories are not on file.  Allergies: No Known Allergies  Medications:                                                                                                                           No current facility-administered medications for this encounter.   Current Outpatient Prescriptions  Medication Sig Dispense Refill  . PRESCRIPTION MEDICATION Apply 1 application topically daily. Nitrogen-Mustard compound. Apply daily to legs and twice weekly to face.         ROS:  History obtained from the patient  General ROS:  negative for - chills, fatigue, fever, night sweats, weight gain or weight loss Psychological ROS: negative for - behavioral disorder, hallucinations, memory difficulties, mood swings or suicidal ideation Ophthalmic ROS: negative for - blurry vision, double vision, eye pain or loss of vision ENT ROS: negative for - epistaxis, nasal discharge, oral lesions, sore throat, tinnitus or vertigo Allergy and Immunology ROS: negative for - hives or itchy/watery eyes Hematological and Lymphatic ROS: negative for - bleeding problems, bruising or swollen lymph nodes Endocrine ROS: negative for - galactorrhea, hair pattern changes, polydipsia/polyuria or temperature intolerance Respiratory ROS: negative for - cough, hemoptysis, shortness of breath or wheezing Cardiovascular ROS: negative for - chest pain, dyspnea on exertion, edema or irregular heartbeat Gastrointestinal ROS: negative for - abdominal pain, diarrhea, hematemesis, nausea/vomiting or stool incontinence Genito-Urinary ROS: negative for - dysuria, hematuria, incontinence or urinary frequency/urgency Musculoskeletal ROS: negative for - joint swelling or muscular weakness Neurological ROS: as noted in HPI Dermatological ROS: negative for rash and skin lesion changes  Neurologic Examination:                                                                                                      Blood pressure 150/95, pulse 84, temperature 98.6 F (37 C), resp. rate 23, weight 83.915 kg (185 lb), SpO2 98.00%.  Mental Status: Alert, oriented, thought content appropriate.  Speech fluent without evidence of aphasia.  Able to follow 3 step commands without difficulty. Cranial Nerves: II: Discs flat bilaterally; Visual fields grossly normal, pupils equal, round, reactive to light and accommodation III,IV, VI: ptosis not present, extra-ocular motions intact bilaterally V,VII: smile symmetric, facial light touch sensation normal bilaterally VIII: hearing  normal bilaterally IX,X: gag reflex present XI: bilateral shoulder shrug XII: midline tongue extension Motor: Right : Upper extremity   5/5    Left:     Upper extremity   5/5  Lower extremity   5/5     Lower extremity   5/5 Tone and bulk:normal tone throughout; no atrophy noted Sensory: Pinprick and light touch intact throughout, bilaterally Deep Tendon Reflexes:  Right: Upper Extremity   Left: Upper extremity   biceps (C-5 to C-6) 2/4   biceps (C-5 to C-6) 2/4 tricep (C7) 2/4    triceps (C7) 2/4 Brachioradialis (C6) 2/4  Brachioradialis (C6) 2/4  Lower Extremity Lower Extremity  quadriceps (L-2 to L-4) 2/4   quadriceps (L-2 to L-4) 2/4 Achilles (S1) 2/4   Achilles (S1) 2/4  Plantars: Right: downgoing   Left: downgoing Cerebellar: normal finger-to-nose,  normal heel-to-shin test CV: pulses palpable throughout    Results for orders placed during the hospital encounter of 02/23/13 (from the past 48 hour(s))  GLUCOSE, CAPILLARY     Status: Abnormal   Collection Time    02/23/13  7:57 AM      Result Value Range   Glucose-Capillary 115 (*) 70 - 99 mg/dL  CBC     Status: None   Collection Time    02/23/13  8:00 AM      Result  Value Range   WBC 6.3  4.0 - 10.5 K/uL   RBC 5.14  4.22 - 5.81 MIL/uL   Hemoglobin 16.5  13.0 - 17.0 g/dL   HCT 45.4  09.8 - 11.9 %   MCV 89.7  78.0 - 100.0 fL   MCH 32.1  26.0 - 34.0 pg   MCHC 35.8  30.0 - 36.0 g/dL   RDW 14.7  82.9 - 56.2 %   Platelets 193  150 - 400 K/uL  DIFFERENTIAL     Status: None   Collection Time    02/23/13  8:00 AM      Result Value Range   Neutrophils Relative % 64  43 - 77 %   Neutro Abs 4.0  1.7 - 7.7 K/uL   Lymphocytes Relative 24  12 - 46 %   Lymphs Abs 1.5  0.7 - 4.0 K/uL   Monocytes Relative 10  3 - 12 %   Monocytes Absolute 0.6  0.1 - 1.0 K/uL   Eosinophils Relative 2  0 - 5 %   Eosinophils Absolute 0.1  0.0 - 0.7 K/uL   Basophils Relative 0  0 - 1 %   Basophils Absolute 0.0  0.0 - 0.1 K/uL  POCT I-STAT  TROPONIN I     Status: None   Collection Time    02/23/13  8:13 AM      Result Value Range   Troponin i, poc 0.00  0.00 - 0.08 ng/mL   Comment 3            Comment: Due to the release kinetics of cTnI,     a negative result within the first hours     of the onset of symptoms does not rule out     myocardial infarction with certainty.     If myocardial infarction is still suspected,     repeat the test at appropriate intervals.  POCT I-STAT, CHEM 8     Status: Abnormal   Collection Time    02/23/13  8:31 AM      Result Value Range   Sodium 141  135 - 145 mEq/L   Potassium 3.5  3.5 - 5.1 mEq/L   Chloride 105  96 - 112 mEq/L   BUN 14  6 - 23 mg/dL   Creatinine, Ser 1.30  0.50 - 1.35 mg/dL   Glucose, Bld 865 (*) 70 - 99 mg/dL   Calcium, Ion 7.84  6.96 - 1.23 mmol/L   TCO2 24  0 - 100 mmol/L   Hemoglobin 17.7 (*) 13.0 - 17.0 g/dL   HCT 29.5  28.4 - 13.2 %   Ct Head Wo Contrast  02/23/2013   *RADIOLOGY REPORT*  Clinical Data: Lightheadedness.  Dyslipidemia.  CT HEAD WITHOUT CONTRAST  Technique:  Contiguous axial images were obtained from the base of the skull through the vertex without contrast.  Comparison: None.  Findings: No intracranial hemorrhage.  No hydrocephalus.  No CT evidence of large acute infarct.  Suggestion of tiny arachnoid cyst anterior middle cranial fossa otherwise no evidence of intracranial mass lesion detected on this unenhanced exam.  Minimal mucosal thickening left frontal sinus and ethmoid sinus air cells.  IMPRESSION: No intracranial hemorrhage.  No CT evidence of large acute infarct.  Suggestion of tiny arachnoid cyst anterior middle cranial fossa otherwise no evidence of intracranial mass lesion detected on this unenhanced exam.  Minimal mucosal thickening left frontal sinus and ethmoid sinus air cells.   Original Report Authenticated By: Viviann Spare  Constance Goltz, M.D.    Assessment and plan discussed with with attending physician and they are in agreement.    Felicie Morn  PA-C [facility] [phone]  02/23/2013, 9:01 AM   Assessment: 52 y.o. male with transient altered speech that lasted for only a few seconds and followed by odd sensation coupled with lethargy.  Patient currently is back to baseline.  Etiology unclear, but must consider seizure given CT findings of tiny arachnoid cyst anterior middle cranial fossa, as well as TIA/CVA.  Stroke Risk Factors - hyperlipidemia  Plan: 1. HgbA1c, fasting lipid panel 2. MRI, MRA  of the brain without contrast 3. Echocardiogram 4. Carotid dopplers 5. Prophylactic therapy-Antiplatelet med: Aspirin - dose 81 mg 6. Risk factor modification 7. Telemetry monitoring 8. Frequent neuro checks 9. PT/OT SLP 10. EEG  Felicie Morn PA-C [facility] [phone]  I personally participated in this patient's evaluation and management, including formulating the above clinical assessment and management recommendations.  Venetia Maxon M.D. [facility] [phone]

## 2013-02-23 NOTE — Progress Notes (Signed)
Utilization review completed.  

## 2013-02-24 LAB — LIPID PANEL
HDL: 52 mg/dL (ref >39)
LDL Cholesterol: 158 mg/dL — ABNORMAL HIGH (ref 0–99)
Triglycerides: 129 mg/dL (ref <150)

## 2013-02-24 LAB — HEMOGLOBIN A1C
Hgb A1c MFr Bld: 5.8 % — ABNORMAL HIGH (ref <5.7)
Mean Plasma Glucose: 120 mg/dL — ABNORMAL HIGH (ref <117)

## 2013-02-24 MED ORDER — SIMVASTATIN 20 MG PO TABS
20.0000 mg | ORAL_TABLET | Freq: Every day | ORAL | Status: DC
  Filled 2013-02-24: qty 1

## 2013-02-24 MED ORDER — ASPIRIN 325 MG PO TABS: 325.0000 mg | ORAL_TABLET | Freq: Every day | ORAL | Status: DC

## 2013-02-24 MED ORDER — ASPIRIN 81 MG PO TBEC: 81.0000 mg | DELAYED_RELEASE_TABLET | Freq: Every day | ORAL | Status: DC

## 2013-02-24 NOTE — Progress Notes (Signed)
OT Cancellation Note  Patient Details Name: Hayden Huang MRN: [id number] DOB: [birth year]   Cancelled Treatment:    Reason Eval/Treat Not Completed: Spoke with PT who was having Pt do high level visual tracking exercises. OT screened, no needs identified, will sign off  Sherryl Manges 02/24/2013, 1:46 PM

## 2013-02-24 NOTE — Progress Notes (Signed)
Patient being d/c'd at this time. Discharge instructions read and patient and wife verbalized understanding and signed.

## 2013-02-24 NOTE — Progress Notes (Signed)
Speech Language Pathology Treatment Patient Details Name: MICA RELEFORD MRN: [id number] DOB: 1960-10-17 Today's Date: 02/24/2013 10:15 am   Assessment / Plan / Recommendation Clinical Impression: ST received order for SLE per Stroke Protocol.  Brief screen completed.  No further Speech Language Pathology services warranted as no deficits noted.  ST to sign off.                      GO   Moreen Fowler MS, CCC-SLP [phone]  [facility] 02/24/2013, 10:19 AM

## 2013-02-24 NOTE — Discharge Summary (Signed)
Physician Discharge Summary  BRANSTON HALSTED JYN:[id number] DOB: 11-06-1960 DOA: 02/23/2013  PCP: Ernst Breach, PA-C  Admit date: 02/23/2013 Discharge date: 02/24/2013  Time spent: 30 minutes  Recommendations for Outpatient Follow-up:  Folow up with PCP in 1-2 weeks Follow up on 3mm aneurysm with repeat MRI in 6-12 months  Discharge Diagnoses:  Active Problems:   TIA (transient ischemic attack)   Allergic rhinitis   Dyslipidemia   Mycosis fungoides lymphoma   Arachnoid cyst   Discharge Condition: Stable  Diet recommendation: Regular  Filed Weights   02/23/13 0751 02/23/13 1330  Weight: 83.915 kg (185 lb) 82.237 kg (181 lb 4.8 oz)    History of present illness:  The patient is a 52 y.o. year-old male with history of Myocosis fungoidees lymphoma (cutaneous T-cell lymphoma) on topical mustard ointment Rx at [facility], dyslipidemia, and allergic rhinitis who presents with difficulty speaking. The patient was last at their baseline health around 5:45 AM. The patient states that for the last several weeks he felt well except for a small colocolostomy. This morning around 5:45 AM he was speaking with a colleague in summary he had difficulty getting the words out. He denied any difficulty moving his face or his tongue. He denies focal numbness, tingling, weakness. He was sitting down during the episode and did not attempt to stand or walk. His speech was affected for approximately 2-5 minutes and resolved on its own. He states nothing like this is her happen before so he came immediately to the emergency department. He denied any shaking or jerking movements, loss of bowel or bladder control, or sleepiness after the episode.  In the emergency department, he was seen by neurology who recommended admission under observation and to identify risk factors for stroke. His head CT was negative.  TPA was not administered because symptoms resolved.   Hospital Course:  Difficulty speaking,  transient. 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   Arachnoid cyst, small. 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 Small superior hypophyseal infudibulum.    Procedures:  2D echo 02/23/13 -  Unremarkable  Carotid dopplers 02/23/13 - unremarkable  Consultations:  Neurology  Discharge Exam: Filed Vitals:   02/23/13 2014 02/23/13 2211 02/24/13 0115 02/24/13 0607  BP: 137/91 137/90 130/95 133/98  Pulse: 60 60 63 62  Temp: 97.8 F (36.6 C) 97.7 F (36.5 C) 97.8 F (36.6 C) 97.9 F (36.6 C)  TempSrc: Oral Oral Oral Oral  Resp: 18 20 20 20   Height:      Weight:      SpO2: 98% 97% 99% 99%    General: Awake, in nad Cardiovascular: regular, s1, s2 Respiratory: normal resp effort, no wheezing  Discharge Instructions     Medication List    ASK your doctor about these medications       beclomethasone 42 MCG/SPRAY nasal spray  Commonly known as:  BECONASE-AQ  Place 2 sprays into the nose 2 (two) times daily as needed (sinus allergies). Dose is for each nostril.     PRESCRIPTION MEDICATION  Apply 1 application topically daily. Nitrogen-Mustard compound. Apply daily to  legs and twice weekly to face.       No Known Allergies    The results of significant diagnostics from this hospitalization (including imaging, microbiology, ancillary and laboratory) are listed below for reference.    Significant Diagnostic Studies: Dg Chest 2 View  02/23/2013   *RADIOLOGY REPORT*  Clinical Data: Stroke.  CHEST - 2 VIEW  Comparison: None.  Findings: The heart, mediastinum and hila are within normal limits.  The lungs are clear.  The bony thorax and surrounding soft tissues are unremarkable.  IMPRESSION: Normal chest radiographs.   Original Report Authenticated By: Amie Portland, M.D.   Ct Head Wo Contrast  02/23/2013   *RADIOLOGY REPORT*  Clinical Data: Lightheadedness.  Dyslipidemia.  CT HEAD WITHOUT CONTRAST  Technique:  Contiguous axial images were obtained from the base of the skull through the vertex without contrast.  Comparison: None.  Findings: No intracranial hemorrhage.  No hydrocephalus.  No CT evidence of large acute infarct.  Suggestion of tiny arachnoid cyst anterior middle cranial fossa otherwise no evidence of intracranial mass lesion detected on this unenhanced exam.  Minimal mucosal thickening left frontal sinus and ethmoid sinus air cells.  IMPRESSION: No intracranial hemorrhage.  No CT evidence of large acute infarct.  Suggestion of tiny arachnoid cyst anterior middle cranial fossa otherwise no evidence of intracranial mass lesion detected on this unenhanced exam.  Minimal mucosal thickening left frontal sinus and ethmoid sinus air cells.   Original Report Authenticated By: Lacy Duverney, M.D.   Mr [facility] Wo Contrast  02/23/2013   *RADIOLOGY REPORT*  Clinical Data:  52 year old male with dizziness.  Transient episode of weakness and difficulty speaking.  Comparison: Head CT without contrast 0827 hours the same day.  MRI HEAD WITHOUT CONTRAST  Technique: Multiplanar, multiecho pulse sequences of the brain and surrounding structures were obtained according to standard protocol without intravenous contrast.  Findings: Small right anterior middle cranial fossa arachnoid cyst confirmed.  No significant mass effect on the right temporal tip.  Normal cerebral volume. No restricted diffusion to suggest acute infarction.  No ventriculomegaly. No acute intracranial hemorrhage identified.  No other intracranial extra-axial collection or mass lesion.  No midline shift or mass effect.  Negative pituitary,  cervicomedullary junction and visualized cervical spine.  Major intracranial vascular flow voids are preserved. Wallace Cullens and white matter signal is within normal limits throughout the brain. Grossly normal visualized internal auditory structures.  Visualized orbit soft tissues are within normal limits.  Mild paranasal sinus mucosal thickening on the left.  Mastoids are clear.  Small retention cysts in the nasopharynx.  Normal bone marrow signal.  Negative scalp soft tissues.  IMPRESSION: 1.  Normal noncontrast MRI appearance of the brain. Incidental small right middle cranial fossa arachnoid cyst confirmed. 2.  See MRA findings below.  MRA HEAD WITHOUT CONTRAST  Technique: Angiographic images of the Circle of Willis were obtained using MRA technique without  intravenous contrast.  Findings: Antegrade flow in the posterior circulation with codominant distal vertebral arteries.  Normal right PICA.  Dominant left AICA tortuous but otherwise normal vertebrobasilar junction and basilar artery.  SCA and right PCA origins are normal.  Fetal type left PCA origin.  The right posterior communicating artery also is present.  Bilateral PCA branches are within normal limits.  Antegrade flow in both ICA siphons.  No ICA stenosis.  Negative left ICA siphon, with normal left ophthalmic and posterior communicating artery origins.  The right ICA siphon is remarkable for an unusually positioned origin  of the right ophthalmic artery, arising laterally from the cavernous segment, see series 10 image 69.  Then in the proximal right supraclinoid ICA at the more conventional site of the ophthalmic artery origin there is a small triangular outpouching measuring up to 3 mm (series 1004 image 9).  This could be a superior hypophoseal infundibulum or small aneurysm.  Carotid termini are patent.  MCA and ACA origins are within normal limits.  Diminutive anterior communicating artery.  Visualized ACA branches are within normal limits.  Visualized  bilateral MCA branches are within normal limits.  IMPRESSION: 1.  No intracranial stenosis or circle of Willis branch occlusion. 2.  Anatomic variation of the right ICA siphon and right ophthalmic artery origin.  There is also a 3 mm outpouching of the right supraclinoid ICA near the anterior genu directed medially which could represent a small superior hypophoseal infundibulum (favored) or aneurysm. At this lesion appears to be too small to treat, surveillance MRA (e.g. annual or biennial) may be most appropriate.   Original Report Authenticated By: Erskine Speed, M.D.   Mr Brain Wo Contrast  02/23/2013   *RADIOLOGY REPORT*  Clinical Data:  52 year old male with dizziness.  Transient episode of weakness and difficulty speaking.  Comparison: Head CT without contrast 0827 hours the same day.  MRI HEAD WITHOUT CONTRAST  Technique: Multiplanar, multiecho pulse sequences of the brain and surrounding structures were obtained according to standard protocol without intravenous contrast.  Findings: Small right anterior middle cranial fossa arachnoid cyst confirmed.  No significant mass effect on the right temporal tip.  Normal cerebral volume. No restricted diffusion to suggest acute infarction.  No ventriculomegaly. No acute intracranial hemorrhage identified.  No other intracranial extra-axial collection or mass lesion.  No midline shift or mass effect.  Negative pituitary, cervicomedullary junction and visualized cervical spine.  Major intracranial vascular flow voids are preserved. Wallace Cullens and white matter signal is within normal limits throughout the brain. Grossly normal visualized internal auditory structures.  Visualized orbit soft tissues are within normal limits.  Mild paranasal sinus mucosal thickening on the left.  Mastoids are clear.  Small retention cysts in the nasopharynx.  Normal bone marrow signal.  Negative scalp soft tissues.  IMPRESSION: 1.  Normal noncontrast MRI appearance of the brain. Incidental small  right middle cranial fossa arachnoid cyst confirmed. 2.  See MRA findings below.  MRA HEAD WITHOUT CONTRAST  Technique: Angiographic images of the Circle of Willis were obtained using MRA technique without  intravenous contrast.  Findings: Antegrade flow in the posterior circulation with codominant distal vertebral arteries.  Normal right PICA.  Dominant left AICA tortuous but otherwise normal vertebrobasilar junction and basilar artery.  SCA and right PCA origins are normal.  Fetal type left PCA origin.  The right posterior communicating artery also is present.  Bilateral PCA branches are within normal limits.  Antegrade flow in both ICA siphons.  No ICA stenosis.  Negative left ICA siphon, with normal left ophthalmic and posterior communicating artery origins.  The right ICA siphon is remarkable for an unusually positioned origin of the right ophthalmic artery, arising laterally from the cavernous segment, see series 10 image 69.  Then in the proximal right supraclinoid ICA at the more conventional site of the ophthalmic artery origin there is a small triangular outpouching measuring up to 3 mm (series 1004 image 9).  This could be a superior hypophoseal infundibulum or small aneurysm.  Carotid termini are patent.  MCA and ACA origins are within normal  limits.  Diminutive anterior communicating artery.  Visualized ACA branches are within normal limits.  Visualized bilateral MCA branches are within normal limits.  IMPRESSION: 1.  No intracranial stenosis or circle of Willis branch occlusion. 2.  Anatomic variation of the right ICA siphon and right ophthalmic artery origin.  There is also a 3 mm outpouching of the right supraclinoid ICA near the anterior genu directed medially which could represent a small superior hypophoseal infundibulum (favored) or aneurysm. At this lesion appears to be too small to treat, surveillance MRA (e.g. annual or biennial) may be most appropriate.   Original Report Authenticated By: Erskine Speed, M.D.    Microbiology: No results found for this or any previous visit (from the past 240 hour(s)).   Labs: Basic Metabolic Panel:  Recent Labs Lab 02/23/13 0800 02/23/13 0831 02/23/13 1410  NA 138 141  --   K 3.5 3.5  --   CL 103 105  --   CO2 24  --   --   GLUCOSE 118* 119*  --   BUN 13 14  --   CREATININE 1.01 1.00 0.93  CALCIUM 9.5  --   --    Liver Function Tests:  Recent Labs Lab 02/23/13 0800  AST 21  ALT 21  ALKPHOS 62  BILITOT 0.3  PROT 7.5  ALBUMIN 4.0   No results found for this basename: LIPASE, AMYLASE,  in the last 168 hours No results found for this basename: AMMONIA,  in the last 168 hours CBC:  Recent Labs Lab 02/23/13 0800 02/23/13 0831 02/23/13 1410  WBC 6.3  --  7.7  NEUTROABS 4.0  --   --   HGB 16.5 17.7* 16.6  HCT 46.1 52.0 46.9  MCV 89.7  --  90.0  PLT 193  --  192   Cardiac Enzymes:  Recent Labs Lab 02/23/13 0805  TROPONINI <0.30   BNP: BNP (last 3 results) No results found for this basename: PROBNP,  in the last 8760 hours CBG:  Recent Labs Lab 02/23/13 0757  GLUCAP 115*       Signed:  CHIU, STEPHEN K  [facility] 02/24/2013, 7:56 AM

## 2013-02-24 NOTE — Progress Notes (Signed)
I agree with the following treatment note after reviewing documentation.   Johnston, Weslie Rasmus Brynn   OTR/L Pager: [phone] Office: [phone] .   

## 2013-02-24 NOTE — Progress Notes (Signed)
Stroke Team Progress Note  HISTORY Hayden Huang is an 52 y.o. male with known mycosis fungoides cutaneous T lymphoma and followed by [facility]. Patient was up at 4 am this morning and went to work. At 5:45 he was getting a coffee and noted he "felt funny like everything was in slow motion". A coworker asked him a question and all he could do was mumble. He knew something was wrong at that time. This only last for a second but then he noted he continued to feel funny and felt very tired. At present time he feels back to his baseline. Exam shows no abnormalities. No history of seizure, no aura, no other symptoms other than noted above. Patient was not a TPA candidate secondary to resolve symptoms. He was admitted  for further evaluation and treatment.  SUBJECTIVE His wife, family are at the bedside.  Overall he feels his condition is completely resolved. Recently worked 30 days in a row; under a lot of stress.  OBJECTIVE Most recent Vital Signs: Filed Vitals:   02/23/13 2211 02/24/13 0115 02/24/13 0607 02/24/13 0931  BP: 137/90 130/95 133/98 129/84  Pulse: 60 63 62 65  Temp: 97.7 F (36.5 C) 97.8 F (36.6 C) 97.9 F (36.6 C) 97.5 F (36.4 C)  TempSrc: Oral Oral Oral Oral  Resp: 20 20 20 20   Height:      Weight:      SpO2: 97% 99% 99% 97%   CBG (last 3)   Recent Labs  02/23/13 0757  GLUCAP 115*    IV Fluid Intake:     MEDICATIONS  . aspirin  300 mg Rectal Daily   Or  . aspirin  325 mg Oral Daily  . enoxaparin (LOVENOX) injection  40 mg Subcutaneous Q24H  . simvastatin  20 mg Oral q1800   PRN:  senna-docusate  CLINICALLY SIGNIFICANT STUDIES Basic Metabolic Panel:  Recent Labs Lab 02/23/13 0800 02/23/13 0831 02/23/13 1410  NA 138 141  --   K 3.5 3.5  --   CL 103 105  --   CO2 24  --   --   GLUCOSE 118* 119*  --   BUN 13 14  --   CREATININE 1.01 1.00 0.93  CALCIUM 9.5  --   --    Liver Function Tests:  Recent Labs Lab 02/23/13 0800  AST 21  ALT 21  ALKPHOS 62   BILITOT 0.3  PROT 7.5  ALBUMIN 4.0   CBC:  Recent Labs Lab 02/23/13 0800 02/23/13 0831 02/23/13 1410  WBC 6.3  --  7.7  NEUTROABS 4.0  --   --   HGB 16.5 17.7* 16.6  HCT 46.1 52.0 46.9  MCV 89.7  --  90.0  PLT 193  --  192   Coagulation:  Recent Labs Lab 02/23/13 0800  LABPROT 12.1  INR 0.91   Cardiac Enzymes:  Recent Labs Lab 02/23/13 0805  TROPONINI <0.30   Urinalysis:  Recent Labs Lab 02/23/13 0920  COLORURINE YELLOW  LABSPEC 1.012  PHURINE 7.5  GLUCOSEU NEGATIVE  HGBUR NEGATIVE  BILIRUBINUR NEGATIVE  KETONESUR NEGATIVE  PROTEINUR NEGATIVE  UROBILINOGEN 1.0  NITRITE NEGATIVE  LEUKOCYTESUR NEGATIVE   Lipid Panel    Component Value Date/Time   CHOL 236* 02/24/2013 0510   TRIG 129 02/24/2013 0510   HDL 52 02/24/2013 0510   CHOLHDL 4.5 02/24/2013 0510   VLDL 26 02/24/2013 0510   LDLCALC 158* 02/24/2013 0510   HgbA1C  No results found for this basename:  HGBA1C    Urine Drug Screen:     Component Value Date/Time   LABOPIA NONE DETECTED 02/23/2013 0920   COCAINSCRNUR NONE DETECTED 02/23/2013 0920   LABBENZ NONE DETECTED 02/23/2013 0920   AMPHETMU NONE DETECTED 02/23/2013 0920   THCU NONE DETECTED 02/23/2013 0920   LABBARB NONE DETECTED 02/23/2013 0920    Alcohol Level:  Recent Labs Lab 02/23/13 0800  ETH <11   CT of the brain  02/23/2013    No intracranial hemorrhage.  No CT evidence of large acute infarct.  Suggestion of tiny arachnoid cyst anterior middle cranial fossa otherwise no evidence of intracranial mass lesion detected on this unenhanced exam.  Minimal mucosal thickening left frontal sinus and ethmoid sinus air cells.    MRI of the brain  02/23/2013     Normal noncontrast MRI appearance of the brain. Incidental small right middle cranial fossa arachnoid cyst confirmed.   MRA of the brain  02/23/2013   1.  No intracranial stenosis or circle of Willis branch occlusion. 2.  Anatomic variation of the right ICA siphon and right ophthalmic artery origin.  There is  also a 3 mm outpouching of the right supraclinoid ICA near the anterior genu directed medially which could represent a small superior hypophoseal infundibulum (favored) or aneurysm. At this lesion appears to be too small to treat, surveillance MRA (e.g. annual or biennial) may be most appropriate.    2D Echocardiogram  EF 55-60% with no source of embolus.   Carotid Doppler  No evidence of hemodynamically significant internal carotid artery stenosis. Vertebral artery flow is antegrade.   CXR  02/23/2013   Normal chest radiographs.     EKG  normal sinus rhythm.   EEG  This is a normal EEG recording during wakefulness and during sleep. No evidence of an epileptic disorder was demonstrated.  Therapy Recommendations no therapy needs  Physical Exam   Pleasant middle-aged [place] American male currently not in distress.Awake alert. Afebrile. Head is nontraumatic. Neck is supple without bruit. Hearing is normal. Cardiac exam no murmur or gallop. Lungs are clear to auscultation. Distal pulses are well felt. Neurological Exam ;  Awake  Alert oriented x 3. Normal speech and language.eye movements full without nystagmus.fundi were not visualized. Vision acuity and fields appear normal. Hearing is normal. Palatal movements are normal. Face symmetric. Tongue midline. Normal strength, tone, reflexes and coordination. Normal sensation. Gait deferred. ASSESSMENT Mr. Hayden Huang is a 52 y.o. male presenting with speech disturbance and feeling funny.  Imaging confirms no acute infarct. Dx: speech disturbance of unknown etiology. TIA/stroke workup negative   On no antithrombotics prior to admission. Now on aspirin 81 mg orally every day for secondary stroke prevention. Patient with no resultant neuro deficits. Work up completed.  Hyperlipidemia, LDL 158, on no statin PTA, now on zocor 20  Hospital day # 1  TREATMENT/PLAN  Continue aspirin 81 mg orally every day for secondary stroke prevention.  Stay  hydrated  Continue zocor No further stroke workup indicated. Ongoing risk factor control by Primary Care Physician Stroke Service will sign off. Please call should any needs arise. No neuro follow up indicated.  Annie Main, MSN, RN, ANVP-BC, ANP-BC, [facility] Pager: [phone] 02/24/2013 11:17 AM  I have personally obtained a history, examined the patient, evaluated imaging results, and formulated the assessment and plan of care. I agree with the above. Delia Heady, MD

## 2013-02-24 NOTE — Evaluation (Signed)
Physical Therapy Evaluation Patient Details Name: Hayden Huang MRN: [id number] DOB: [birth year] Today's Date: 02/24/2013 Time: 4098-1191 PT Time Calculation (min): 23 min  PT Assessment / Plan / Recommendation History of Present Illness  pt admitted after having about a 5 min episode of trouble speaking and general weakness.  He fully resolved prior to admission.  All tests have come back  negative.  Clinical Impression  Back to baseline function, age appropriate mobility and balance.  No further PT needs    PT Assessment  Patent does not need any further PT services    Follow Up Recommendations  No PT follow up    Does the patient have the potential to tolerate intense rehabilitation      Barriers to Discharge        Equipment Recommendations  None recommended by PT    Recommendations for Other Services     Frequency      Precautions / Restrictions Precautions Precautions: None Restrictions Weight Bearing Restrictions: No   Pertinent Vitals/Pain       Mobility  Bed Mobility Bed Mobility: Supine to Sit;Sitting - Scoot to Edge of Bed Supine to Sit: 7: Independent Sitting - Scoot to [place] of Bed: 7: Independent Transfers Transfers: Sit to Stand;Stand to Sit Sit to Stand: 7: Independent Stand to Sit: 7: Independent Ambulation/Gait Ambulation/Gait Assistance: 7: Independent Ambulation Distance (Feet): 800 Feet Assistive device: None Ambulation/Gait Assistance Details: steady and safe with high level balance tasks Gait Pattern: Within Functional Limits Gait velocity: subjective community level safe speed Stairs: Yes Stair Management Technique: No rails;Alternating pattern;Forwards Number of Stairs: 7 Wheelchair Mobility Wheelchair Mobility: No    Exercises     PT Diagnosis:    PT Problem List:   PT Treatment Interventions:       PT Goals(Current goals can be found in the care plan section)    Visit Information  Last PT Received On:  02/24/13 Assistance Needed: +1 History of Present Illness: pt admitted after having about a 5 min episode of trouble speaking and general weakness.  He fully resolved prior to admission.  All tests have come back  negative.       Prior Functioning  Home Living Family/patient expects to be discharged to:: Private residence Living Arrangements: Spouse/significant other;Children Available Help at Discharge: Family Type of Home: House Home Access: Stairs to enter Secretary/administrator of Steps: 4 Entrance Stairs-Rails: Left;Right Home Layout: One level Home Equipment: Cane - single point Prior Function Level of Independence: Independent Communication Communication: No difficulties    Cognition  Cognition Arousal/Alertness: Awake/alert Behavior During Therapy: WFL for tasks assessed/performed Overall Cognitive Status: Within Functional Limits for tasks assessed    Extremity/Trunk Assessment Upper Extremity Assessment Upper Extremity Assessment: Defer to OT evaluation Lower Extremity Assessment Lower Extremity Assessment: Overall WFL for tasks assessed Cervical / Trunk Assessment Cervical / Trunk Assessment: Normal   Balance Balance Balance Assessed: No High Level Balance High Level Balance Activites: Backward walking;Turns;Direction changes;Sudden stops;Head turns;Other (comment) (picking up objects "onthe fly") High Level Balance Comments: safe, no deviation  End of Session PT - End of Session Activity Tolerance: Patient tolerated treatment well Patient left: with family/visitor present;Other (comment) (sitting) Nurse Communication: Mobility status  GP     Casandra Dallaire, Eliseo Gum 02/24/2013, 10:34 AM 02/24/2013  [place] Bing, PT [phone]  (pager)

## 2013-02-25 NOTE — Progress Notes (Signed)
Late Entry--Addendum to Evaluation 7/9  02/24/13 1021  PT Time Calculation  PT Start Time 1002  PT Stop Time 1025  PT Time Calculation (min) 23 min  PT G-Codes **NOT FOR INPATIENT CLASS**  Functional Assessment Tool Used clinical judgement  Functional Limitation Mobility: Walking and moving around  Mobility: Walking and Moving Around Current Status [phone]) [facility]  Mobility: Walking and Moving Around Goal Status ([id number]) [facility]  Mobility: Walking and Moving Around Discharge Status ([id number]) [facility]  PT General Charges  $$ ACUTE PT VISIT 1 Procedure  PT Evaluation  $Initial PT Evaluation Tier I 1 Procedure  PT Treatments  $Gait Training 8-22 mins  02/25/2013  [place] Bing, PT [phone]  (pager)

## 2013-03-01 ENCOUNTER — Ambulatory Visit (INDEPENDENT_AMBULATORY_CARE_PROVIDER_SITE_OTHER): Payer: BC Managed Care – PPO | Admitting: Medical

## 2013-03-01 ENCOUNTER — Encounter: Payer: Self-pay | Admitting: Medical

## 2013-03-01 VITALS — BP 122/90 | HR 88 | Temp 98.0°F | Resp 16 | Wt 182.0 lb

## 2013-03-01 DIAGNOSIS — C8590 Non-Hodgkin lymphoma, unspecified, unspecified site: Secondary | ICD-10-CM

## 2013-03-01 DIAGNOSIS — E785 Hyperlipidemia, unspecified: Secondary | ICD-10-CM

## 2013-03-01 DIAGNOSIS — R030 Elevated blood-pressure reading, without diagnosis of hypertension: Secondary | ICD-10-CM

## 2013-03-01 DIAGNOSIS — C8589 Other specified types of non-Hodgkin lymphoma, extranodal and solid organ sites: Secondary | ICD-10-CM

## 2013-03-01 DIAGNOSIS — R930 Abnormal findings on diagnostic imaging of skull and head, not elsewhere classified: Secondary | ICD-10-CM

## 2013-03-01 DIAGNOSIS — G459 Transient cerebral ischemic attack, unspecified: Secondary | ICD-10-CM

## 2013-03-01 MED ORDER — SIMVASTATIN 20 MG PO TABS: 20.0000 mg | ORAL_TABLET | Freq: Every day | ORAL | Status: DC

## 2013-03-01 NOTE — Progress Notes (Signed)
Subjective:  Hayden Huang is a 52 y.o. male who presents for hospital f/u.  Was hospitalized 02/23/13-02/24/13 for TIA.  Had been at work earlier that day and had difficulty forming words after coworker was talking to him.  EMS was called, and he ended up being hospitalized for acute TIA/stroke workup.   Had full workup, no abnormality on most of the studies, but had 2 things found on MRI that weren't infarct.  He says ultimately they didn't seen anything abnormal that needed urgent treatment.   Was told to f/u with primary care.  No prior similar.  Of note, he works for [employer], has been there 22 years.  However, has worked 30 days straight the past month, 7 day work weeks with no break due to increased work load.  Has been tired and stressed with the work load.  No other aggravating or relieving factors.    No other c/o.  The following portions of the patient's history were reviewed and updated as appropriate: allergies, current medications, past family history, past medical history, past social history, past surgical history and problem list.  Past Medical History  Diagnosis Date  . Allergic rhinitis   . Dyslipidemia   . Sezary syndrome   . Mycosis fungoides lymphoma     cutaneous T-cell lymphoma  . Heart murmur     ROS Otherwise as in subjective above  Objective: Physical Exam  Vital signs reviewed  General appearance: alert, no distress, WD/WN Neck: supple, no lymphadenopathy, no thyromegaly, no masses, no bruits Heart: RRR, normal S1, S2, no murmurs Lungs: CTA bilaterally, no wheezes, rhonchi, or rales Pulses: 2+ radial pulses, 2+ pedal pulses, normal cap refill Ext: no edema Neuro: CN2-12 intact, normal strength, sensation, DTRs, nonfocal exam   Assessment: Encounter Diagnoses  Name Primary?  . TIA (transient ischemic attack) Yes  . Elevated blood pressure reading without diagnosis of hypertension   . Hyperlipidemia   . Lymphoma      Plan: Discharge summary shows  diagnosis of TIA.  I reviewed his recent work up including MRI/MRA, carotid dopplers, Echo, EEG.  Of notes MRI showed questionable 3mm aneurysm that will need f/u MRI 6-12 mo.   At this point he will begin Zocor which he has not started, c/t 81mg  Aspirin QHS, he is making healthier diet changes, will avoid salt in diet, will work on stress reduction.   Advised he begin checking BP and bring back BP journal in 6wk.  discussed signs of CIA that would prompt 911 call.  Follow up: 6wk.

## 2013-03-01 NOTE — Patient Instructions (Signed)
Monitor blood pressures, and write these down.  Check 3 times a week, and bring back the BP readings in 6 weeks.    Begin Zocor 20mg  daily at bedtime with Aspirin 81mg  daily.   Begin exercising with walking, preferably 150 minutes per week.  Eat a healthy low fat diet, avoid added salt, processed foods, and avoid lots of red meat.     Hypertriglyceridemia  Diet for High blood levels of Triglycerides Most fats in food are triglycerides. Triglycerides in your blood are stored as fat in your body. High levels of triglycerides in your blood may put you at a greater risk for heart disease and stroke.  Normal triglyceride levels are less than 150 mg/dL. Borderline high levels are 150-199 mg/dl. High levels are 200 - 499 mg/dL, and very high triglyceride levels are greater than 500 mg/dL. The decision to treat high triglycerides is generally based on the level. For people with borderline or high triglyceride levels, treatment includes weight loss and exercise. Drugs are recommended for people with very high triglyceride levels. Many people who need treatment for high triglyceride levels have metabolic syndrome. This syndrome is a collection of disorders that often include: insulin resistance, high blood pressure, blood clotting problems, high cholesterol and triglycerides. TESTING PROCEDURE FOR TRIGLYCERIDES  You should not eat 4 hours before getting your triglycerides measured. The normal range of triglycerides is between 10 and 250 milligrams per deciliter (mg/dl). Some people may have extreme levels (1000 or above), but your triglyceride level may be too high if it is above 150 mg/dl, depending on what other risk factors you have for heart disease.  People with high blood triglycerides may also have high blood cholesterol levels. If you have high blood cholesterol as well as high blood triglycerides, your risk for heart disease is probably greater than if you only had high triglycerides. High blood  cholesterol is one of the main risk factors for heart disease. CHANGING YOUR DIET  Your weight can affect your blood triglyceride level. If you are more than 20% above your ideal body weight, you may be able to lower your blood triglycerides by losing weight. Eating less and exercising regularly is the best way to combat this. Fat provides more calories than any other food. The best way to lose weight is to eat less fat. Only 30% of your total calories should come from fat. Less than 7% of your diet should come from saturated fat. A diet low in fat and saturated fat is the same as a diet to decrease blood cholesterol. By eating a diet lower in fat, you may lose weight, lower your blood cholesterol, and lower your blood triglyceride level.  Eating a diet low in fat, especially saturated fat, may also help you lower your blood triglyceride level. Ask your dietitian to help you figure how much fat you can eat based on the number of calories your caregiver has prescribed for you.  Exercise, in addition to helping with weight loss may also help lower triglyceride levels.   Alcohol can increase blood triglycerides. You may need to stop drinking alcoholic beverages.  Too much carbohydrate in your diet may also increase your blood triglycerides. Some complex carbohydrates are necessary in your diet. These may include bread, rice, potatoes, other starchy vegetables and cereals.  Reduce "simple" carbohydrates. These may include pure sugars, candy, honey, and jelly without losing other nutrients. If you have the kind of high blood triglycerides that is affected by the amount of carbohydrates in  your diet, you will need to eat less sugar and less high-sugar foods. Your caregiver can help you with this.  Adding 2-4 grams of fish oil (EPA+ DHA) may also help lower triglycerides. Speak with your caregiver before adding any supplements to your regimen. Following the Diet  Maintain your ideal weight. Your caregivers  can help you with a diet. Generally, eating less food and getting more exercise will help you lose weight. Joining a weight control group may also help. Ask your caregivers for a good weight control group in your area.  Eat low-fat foods instead of high-fat foods. This can help you lose weight too.  These foods are lower in fat. Eat MORE of these:   Dried beans, peas, and lentils.  Egg whites.  Low-fat cottage cheese.  Fish.  Lean cuts of meat, such as round, sirloin, rump, and flank (cut extra fat off meat you fix).  Whole grain breads, cereals and pasta.  Skim and nonfat dry milk.  Low-fat yogurt.  Poultry without the skin.  Cheese made with skim or part-skim milk, such as mozzarella, parmesan, farmers', ricotta, or pot cheese. These are higher fat foods. Eat LESS of these:   Whole milk and foods made from whole milk, such as American, blue, cheddar, monterey jack, and swiss cheese  High-fat meats, such as luncheon meats, sausages, knockwurst, bratwurst, hot dogs, ribs, corned beef, ground pork, and regular ground beef.  Fried foods. Limit saturated fats in your diet. Substituting unsaturated fat for saturated fat may decrease your blood triglyceride level. You will need to read package labels to know which products contain saturated fats.  These foods are high in saturated fat. Eat LESS of these:   Fried pork skins.  Whole milk.  Skin and fat from poultry.  Palm oil.  Butter.  Shortening.  Cream cheese.  Tomasa Blase.  Margarines and baked goods made from listed oils.  Vegetable shortenings.  Chitterlings.  Fat from meats.  Coconut oil.  Palm kernel oil.  Lard.  Cream.  Sour cream.  Fatback.  Coffee whiteners and non-dairy creamers made with these oils.  Cheese made from whole milk. Use unsaturated fats (both polyunsaturated and monounsaturated) moderately. Remember, even though unsaturated fats are better than saturated fats; you still want a diet  low in total fat.  These foods are high in unsaturated fat:   Canola oil.  Sunflower oil.  Mayonnaise.  Almonds.  Peanuts.  Pine nuts.  Margarines made with these oils.  Safflower oil.  Olive oil.  Avocados.  Cashews.  Peanut butter.  Sunflower seeds.  Soybean oil.  Peanut oil.  Olives.  Pecans.  Walnuts.  Pumpkin seeds. Avoid sugar and other high-sugar foods. This will decrease carbohydrates without decreasing other nutrients. Sugar in your food goes rapidly to your blood. When there is excess sugar in your blood, your liver may use it to make more triglycerides. Sugar also contains calories without other important nutrients.  Eat LESS of these:   Sugar, brown sugar, powdered sugar, jam, jelly, preserves, honey, syrup, molasses, pies, candy, cakes, cookies, frosting, pastries, colas, soft drinks, punches, fruit drinks, and regular gelatin.  Avoid alcohol. Alcohol, even more than sugar, may increase blood triglycerides. In addition, alcohol is high in calories and low in nutrients. Ask for sparkling water, or a diet soft drink instead of an alcoholic beverage. Suggestions for planning and preparing meals   Bake, broil, grill or roast meats instead of frying.  Remove fat from meats and skin from poultry  before cooking.  Add spices, herbs, lemon juice or vinegar to vegetables instead of salt, rich sauces or gravies.  Use a non-stick skillet without fat or use no-stick sprays.  Cool and refrigerate stews and broth. Then remove the hardened fat floating on the surface before serving.  Refrigerate meat drippings and skim off fat to make low-fat gravies.  Serve more fish.  Use less butter, margarine and other high-fat spreads on bread or vegetables.  Use skim or reconstituted non-fat dry milk for cooking.  Cook with low-fat cheeses.  Substitute low-fat yogurt or cottage cheese for all or part of the sour cream in recipes for sauces, dips or congealed  salads.  Use half yogurt/half mayonnaise in salad recipes.  Substitute evaporated skim milk for cream. Evaporated skim milk or reconstituted non-fat dry milk can be whipped and substituted for whipped cream in certain recipes.  Choose fresh fruits for dessert instead of high-fat foods such as pies or cakes. Fruits are naturally low in fat. When Dining Out   Order low-fat appetizers such as fruit or vegetable juice, pasta with vegetables or tomato sauce.  Select clear, rather than cream soups.  Ask that dressings and gravies be served on the side. Then use less of them.  Order foods that are baked, broiled, poached, steamed, stir-fried, or roasted.  Ask for margarine instead of butter, and use only a small amount.  Drink sparkling water, unsweetened tea or coffee, or diet soft drinks instead of alcohol or other sweet beverages. QUESTIONS AND ANSWERS ABOUT OTHER FATS IN THE BLOOD: SATURATED FAT, TRANS FAT, AND CHOLESTEROL What is trans fat? Trans fat is a type of fat that is formed when vegetable oil is hardened through a process called hydrogenation. This process helps makes foods more solid, gives them shape, and prolongs their shelf life. Trans fats are also called hydrogenated or partially hydrogenated oils.  What do saturated fat, trans fat, and cholesterol in foods have to do with heart disease? Saturated fat, trans fat, and cholesterol in the diet all raise the level of LDL "bad" cholesterol in the blood. The higher the LDL cholesterol, the greater the risk for coronary heart disease (CHD). Saturated fat and trans fat raise LDL similarly.  What foods contain saturated fat, trans fat, and cholesterol? High amounts of saturated fat are found in animal products, such as fatty cuts of meat, chicken skin, and full-fat dairy products like butter, whole milk, cream, and cheese, and in tropical vegetable oils such as palm, palm kernel, and coconut oil. Trans fat is found in some of the same  foods as saturated fat, such as vegetable shortening, some margarines (especially hard or stick margarine), crackers, cookies, baked goods, fried foods, salad dressings, and other processed foods made with partially hydrogenated vegetable oils. Small amounts of trans fat also occur naturally in some animal products, such as milk products, beef, and lamb. Foods high in cholesterol include liver, other organ meats, egg yolks, shrimp, and full-fat dairy products. How can I use the new food label to make heart-healthy food choices? Check the Nutrition Facts panel of the food label. Choose foods lower in saturated fat, trans fat, and cholesterol. For saturated fat and cholesterol, you can also use the Percent Daily Value (%DV): 5% DV or less is low, and 20% DV or more is high. (There is no %DV for trans fat.) Use the Nutrition Facts panel to choose foods low in saturated fat and cholesterol, and if the trans fat is not listed, read  the ingredients and limit products that list shortening or hydrogenated or partially hydrogenated vegetable oil, which tend to be high in trans fat. POINTS TO REMEMBER:   Discuss your risk for heart disease with your caregivers, and take steps to reduce risk factors.  Change your diet. Choose foods that are low in saturated fat, trans fat, and cholesterol.  Add exercise to your daily routine if it is not already being done. Participate in physical activity of moderate intensity, like brisk walking, for at least 30 minutes on most, and preferably all days of the week. No time? Break the 30 minutes into three, 10-minute segments during the day.  Stop smoking. If you do smoke, contact your caregiver to discuss ways in which they can help you quit.  Do not use street drugs.  Maintain a normal weight.  Maintain a healthy blood pressure.  Keep up with your blood work for checking the fats in your blood as directed by your caregiver. Document Released: 05/23/2004 Document Revised:  02/04/2012 Document Reviewed: 12/19/2008 [facility] Patient Information 2014 [place], [place].    Transient Ischemic Attack A transient ischemic attack (TIA) is a "warning stroke" that causes stroke-like symptoms. Unlike a stroke, a TIA does not cause permanent damage to the brain. The symptoms of a TIA can happen very fast and do not last long. It is important to know the symptoms of a TIA and what to do. This can help prevent a major stroke or death. CAUSES   A TIA is caused by a temporary blockage in an artery in the brain or neck (carotid artery). The blockage does not allow the brain to get the blood supply it needs and can cause different symptoms. The blockage can be caused by either:  A blood clot.  Fatty buildup (plaque) in a neck or brain artery. RISK FACTORS  High blood pressure (hypertension).  High cholesterol.  Diabetes mellitus.  Heart disease.  The build up of plaque in the blood vessels (peripheral artery disease or atherosclerosis).  The build up of plaque in the blood vessels providing blood and oxygen to the brain (carotid artery stenosis).  An abnormal heart rhythm (atrial fibrillation).  Obesity.  Smoking.  Taking oral contraceptives (especially in combination with smoking).  Physical inactivity.  A diet high in fats, salt (sodium), and calories.  Alcohol use.  Use of illegal drugs (especially cocaine and methamphetamine).  Being male.  Being African American.  Being over the age of 80.  Family history of stroke.  Previous history of blood clots, stroke, TIA, or heart attack.  Sickle cell disease. SYMPTOMS  TIA symptoms are the same as a stroke but are temporary. These symptoms usually develop suddenly, or may be newly present upon awakening from sleep:  Sudden weakness or numbness of the face, arm, or leg, especially on one side of the body.  Sudden trouble walking or difficulty moving arms or legs.  Sudden confusion.  Sudden  personality changes.  Trouble speaking (aphasia) or understanding.  Difficulty swallowing.  Sudden trouble seeing in one or both eyes.  Double vision.  Dizziness.  Loss of balance or coordination.  Sudden severe headache with no known cause.  Trouble reading or writing.  Loss of bowel or bladder control.  Loss of consciousness. DIAGNOSIS  Your caregiver may be able to determine the presence or absence of a TIA based on your symptoms, history, and physical exam. Computed tomography (CT scan) of the brain is usually performed to help identify a TIA. Other tests  may be done to diagnose a TIA. These tests may include:  Electrocardiography.  Continuous heart monitoring.  Echocardiography.  Carotid ultrasonography.  Magnetic resonance imaging (MRI).  A scan of the brain circulation.  Blood tests. PREVENTION  The risk of a TIA can be decreased by appropriately treating high blood pressure, high cholesterol, diabetes, heart disease, and obesity and by quitting smoking, limiting alcohol, and staying physically active. TREATMENT  Time is of the essence. Since the symptoms of TIA are the same as a stroke, it is important to seek treatment within 3 4 hours of the start of symptoms because you may receive a medicine to dissolve the clot (thrombolytic) that cannot be given after that time. Treatment options vary. Treatment options may include rest, oxygen, intravenous (IV) fluids, and medicines to thin the blood (anticoagulants). Medicines and diet may be used to address diabetes, high blood pressure, and other risk factors. Measures will be taken to prevent short-term and long-term complications, including infection from breathing foreign material into the lungs (aspiration pneumonia), blood clots in the legs, and falls. Treatment options include procedures to either remove plaque in the carotid arteries or dilate carotid arteries that have narrowed due to plaque. Those procedures  are:  Carotid endarterectomy.  Carotid angioplasty and stenting. HOME CARE INSTRUCTIONS   Take all medicines prescribed by your caregiver. Follow the directions carefully. Medicines may be used to control risk factors for a stroke. Be sure you understand all your medicine instructions.  You may be told to take aspirin or the anticoagulant warfarin. Warfarin needs to be taken exactly as instructed.  Taking too much or too little warfarin is dangerous. Too much warfarin increases the risk of bleeding. Too little warfarin continues to allow the risk for blood clots. While taking warfarin, you will need to have regular blood tests to measure your blood clotting time. A PT blood test measures how long it takes for blood to clot. Your PT is used to calculate another value called an INR. Your PT and INR help your caregiver to adjust your dose of warfarin. The dose can change for many reasons. It is critically important that you take warfarin exactly as prescribed.  Many foods, especially foods high in vitamin K can interfere with warfarin and affect the PT and INR. Foods high in vitamin K include spinach, kale, broccoli, cabbage, collard and turnip greens, brussels sprouts, peas, cauliflower, seaweed, and parsley as well as beef and pork liver, green tea, and soybean oil. You should eat a consistent amount of foods high in vitamin K. Avoid major changes in your diet, or notify your caregiver before changing your diet. Arrange a visit with a dietitian to answer your questions.  Many medicines can interfere with warfarin and affect the PT and INR. You must tell your caregiver about any and all medicines you take, this includes all vitamins and supplements. Be especially cautious with aspirin and anti-inflammatory medicines. Do not take or discontinue any prescribed or over-the-counter medicine except on the advice of your caregiver or pharmacist.  Warfarin can have side effects, such as excessive bruising or  bleeding. You will need to hold pressure over cuts for longer than usual. Your caregiver or pharmacist will discuss other potential side effects.  Avoid sports or activities that may cause injury or bleeding.  Be mindful when shaving, flossing your teeth, or handling sharp objects.  Alcohol can change the body's ability to handle warfarin. It is best to avoid alcoholic drinks or consume only very small  amounts while taking warfarin. Notify your caregiver if you change your alcohol intake.  Notify your dentist or other caregivers before procedures.  Eat a diet that includes 5 or more servings of fruits and vegetables each day. This may reduce the risk of stroke. Certain diets may be prescribed to address high blood pressure, high cholesterol, diabetes, or obesity.  A low-sodium, low-saturated fat, low-trans fat, low-cholesterol diet is recommended to manage high blood pressure.  A low-saturated fat, low-trans fat, low-cholesterol, and high-fiber diet may control cholesterol levels.  A controlled-carbohydrate, controlled-sugar diet is recommended to manage diabetes.  A reduced-calorie, low-sodium, low-saturated fat, low-trans fat, low-cholesterol diet is recommended to manage obesity.  Maintain a healthy weight.  Stay physically active. It is recommended that you get at least 30 minutes of activity on most or all days.  Do not smoke.  Limit alcohol use even if you are not taking warfarin. Moderate alcohol use is considered to be:  No more than 2 drinks each day for men.  No more than 1 drink each day for nonpregnant women.  Stop drug abuse.  Home safety. A safe home environment is important to reduce the risk of falls. Your caregiver may arrange for specialists to evaluate your home. Having grab bars in the bedroom and bathroom is often important. Your caregiver may arrange for equipment to be used at home, such as raised toilets and a seat for the shower.  Follow all instructions  for follow-up with your caregiver. This is very important. This includes any referrals and lab tests. Proper follow up can prevent a stroke or another TIA from occurring. SEEK MEDICAL CARE IF:  You have personality changes.  You have difficulty swallowing.  You are seeing double.  You have dizziness.  You have a fever.  You have skin breakdown. SEEK IMMEDIATE MEDICAL CARE IF:  Any of these symptoms may represent a serious problem that is an emergency. Do not wait to see if the symptoms will go away. Get medical help right away. Call your local emergency services (911 in [place] Do not drive yourself to the hospital.  You have sudden weakness or numbness of the face, arm, or leg, especially on one side of the body.  You have sudden trouble walking or difficulty moving arms or legs.  You have sudden confusion.  You have trouble speaking (aphasia) or understanding.  You have sudden trouble seeing in one or both eyes.  You have a loss of balance or coordination.  You have a sudden, severe headache with no known cause.  You have new chest pain or an irregular heartbeat.  You have a partial or total loss of consciousness. MAKE SURE YOU:   Understand these instructions.  Will watch your condition.  Will get help right away if you are not doing well or get worse. Document Released: 05/15/2005 Document Revised: 07/22/2012 Document Reviewed: 09/28/2009 [facility] Patient Information 2014 [place], [place].

## 2013-03-19 DIAGNOSIS — Z9289 Personal history of other medical treatment: Secondary | ICD-10-CM

## 2013-03-19 HISTORY — DX: Personal history of other medical treatment: Z92.89

## 2013-04-14 ENCOUNTER — Ambulatory Visit (INDEPENDENT_AMBULATORY_CARE_PROVIDER_SITE_OTHER): Payer: BC Managed Care – PPO | Admitting: Medical

## 2013-04-14 ENCOUNTER — Encounter: Payer: Self-pay | Admitting: Medical

## 2013-04-14 VITALS — BP 122/80 | HR 72 | Temp 97.8°F | Resp 16 | Wt 177.0 lb

## 2013-04-14 DIAGNOSIS — G459 Transient cerebral ischemic attack, unspecified: Secondary | ICD-10-CM

## 2013-04-14 DIAGNOSIS — E785 Hyperlipidemia, unspecified: Secondary | ICD-10-CM

## 2013-04-14 DIAGNOSIS — R030 Elevated blood-pressure reading, without diagnosis of hypertension: Secondary | ICD-10-CM

## 2013-04-14 NOTE — Progress Notes (Signed)
Subjective:  Hayden Huang is a 52 y.o. male who presents for recheck on cholesterol.  He is fasting today.  I last saw him in July after hospital f/u for recent TIA.   He has had no additional TIA symptoms such as speech problems, confusion or other.  Compliance with treatment has been excellent. The patient exercises daily.  Patient uses the following dietary modifications in regards to their lipid abnormalities Zocor 20mg .  Patient denies muscle pain associated with male medications.  Patient's cardiac risk factors are: dyslipidemia, male gender and TIA 2014.  He has been checking BPs which have been in normal range.  He has worked on reducing stress.  Really trying to do better with diet and exercise since the hospitalization.  No other c/o.   The following portions of the patient's history were reviewed and updated as appropriate: allergies, current medications, past family history, past medical history, past social history, past surgical history and problem list.  Past Medical History  Diagnosis Date  . Allergic rhinitis   . Dyslipidemia   . Heart murmur   . TIA (transient ischemic attack) 7/14  . Sezary syndrome   . Mycosis fungoides lymphoma     cutaneous T-cell lymphoma; in remission, sees oncology yearly  . Abnormal MRA, brain 02/23/13    3mm small superior hypophoseal infundibulum (favored) vs aneurysm  . History of MRI of brain and brain stem 02/23/13    Normal noncontrast MRI appearance of the brain. Incidental small right middle cranial fossa arachnoid cyst confirmed  . H/O echocardiogram 7/14    TTE, normal LV function, concentric LV moderate hypertrophy, 65-70% EF  . H/O diagnostic ultrasound 8/14    carotid, normal    ROS Otherwise as in subjective above  Objective: Physical Exam  Vital signs reviewed  General appearance: alert, no distress, WD/WN Neck: supple, no lymphadenopathy, no thyromegaly, no masses, no bruits Heart: RRR, normal S1, S2, no murmurs Lungs: CTA  bilaterally, no wheezes, rhonchi, or rales Abdomen: +bs, soft, nontender, no mass or organomegaly Pulses: 2+ radial pulses, 2+ pedal pulses, normal cap refill Ext: no edema  Assessment: Encounter Diagnoses  Name Primary?  . Hyperlipidemia Yes  . TIA (transient ischemic attack)   . Elevated blood-pressure reading without diagnosis of hypertension     Plan: Fasting labs today, he is compliant with diet, exercise, and medication.  C/t 81mg  ASA daily, c/t Zocor, c/t monitoring BP.

## 2013-04-15 LAB — VITAMIN D 25 HYDROXY (VIT D DEFICIENCY, FRACTURES): Vit D, 25-Hydroxy: 36 ng/mL (ref 30–89)

## 2013-04-15 LAB — LIPID PANEL
LDL Cholesterol: 66 mg/dL (ref 0–99)
VLDL: 13 mg/dL (ref 0–40)

## 2013-04-15 LAB — HEPATIC FUNCTION PANEL
ALT: 23 U/L (ref 0–53)
Bilirubin, Direct: 0.1 mg/dL (ref 0.0–0.3)
Indirect Bilirubin: 0.4 mg/dL (ref 0.0–0.9)

## 2013-04-20 ENCOUNTER — Telehealth: Payer: Self-pay | Admitting: Internal Medicine

## 2013-04-20 MED ORDER — SIMVASTATIN 20 MG PO TABS: 20.0000 mg | ORAL_TABLET | Freq: Every day | ORAL | Status: DC

## 2013-04-20 NOTE — Telephone Encounter (Signed)
Requesting a 90 day supply.

## 2013-05-18 ENCOUNTER — Ambulatory Visit (INDEPENDENT_AMBULATORY_CARE_PROVIDER_SITE_OTHER): Payer: BC Managed Care – PPO | Admitting: Medical

## 2013-05-18 ENCOUNTER — Encounter: Payer: Self-pay | Admitting: Medical

## 2013-05-18 VITALS — BP 122/80 | HR 64 | Temp 98.3°F | Resp 16 | Wt 175.0 lb

## 2013-05-18 DIAGNOSIS — E785 Hyperlipidemia, unspecified: Secondary | ICD-10-CM

## 2013-05-19 NOTE — Progress Notes (Signed)
Entered in error

## 2013-06-24 ENCOUNTER — Other Ambulatory Visit: Payer: Self-pay

## 2015-01-18 ENCOUNTER — Ambulatory Visit (INDEPENDENT_AMBULATORY_CARE_PROVIDER_SITE_OTHER): Payer: BLUE CROSS/BLUE SHIELD | Admitting: Medical

## 2015-01-18 ENCOUNTER — Telehealth: Payer: Self-pay | Admitting: Medical

## 2015-01-18 ENCOUNTER — Encounter: Payer: Self-pay | Admitting: Medical

## 2015-01-18 VITALS — BP 120/88 | HR 81 | Resp 15 | Wt 187.0 lb

## 2015-01-18 DIAGNOSIS — M7711 Lateral epicondylitis, right elbow: Secondary | ICD-10-CM | POA: Diagnosis not present

## 2015-01-18 DIAGNOSIS — Z1211 Encounter for screening for malignant neoplasm of colon: Secondary | ICD-10-CM

## 2015-01-18 MED ORDER — DICLOFENAC SODIUM 75 MG PO TBEC: 75.0000 mg | DELAYED_RELEASE_TABLET | Freq: Two times a day (BID) | ORAL | Status: DC

## 2015-01-18 MED ORDER — HYDROCODONE-ACETAMINOPHEN 5-325 MG PO TABS: 1.0000 | ORAL_TABLET | Freq: Four times a day (QID) | ORAL | Status: DC | PRN

## 2015-01-18 NOTE — Telephone Encounter (Signed)
Refer for first screening colonoscopy 

## 2015-01-18 NOTE — Progress Notes (Signed)
Subjective Here for c/o right elbow pain x 1.5 mo.   Works on Designer, television/film set at [employer].  Does repetitive motion daily, is right handed .  Been having ongoing right elbow pain x 1.31mo, been using OTC Aleve, OTC tennis elbow strap, some ice, but no relief.  Hasn't really had a chance to not use the arm given his occupation.  No shoulder or wrist pain, no paresthesias.  No neck pain.  No prior similar. No other aggravating or relieving factors.  Also wants referral for first screening colonoscopy. No other complaint.  Objective: BP 120/88 mmHg  Pulse 81  Resp 15  Wt 187 lb (84.823 kg)  Gen: wd, wn, nad Neck: supple, nontender, normal ROM, no mass or thyromegaly MSK: tender over right lateral epicondyle with mild swelling of the same area.  Pain with resisted supination in same area.   Rest of arm nontender, no deformity, normal ROM Arms neurovascularly intact   Assessment: Encounter Diagnoses  Name Primary?  . Tennis elbow, right Yes  . Special screening for malignant neoplasms, colon     Plan: Tennis elbow - discussed findings, treatment, usual timeframe to resolve.    Patient Instructions   Begin Diclofenac twice daily for pain and inflammation.   Take this with food  For worse pain, you may use the Hydrocodone, either at bedtime or at most 3 times daily for severe pain.  Caution - drowsiness  Try to use ice twice daily to the elbow such as a bag of frozen peas for 20 minutes each  Use the tennis elbow strap  Consider using an arm sling to immobilize the arm for periods of time, such as 1-2 hours at a times   Referral to GI for first screening colonoscopy.   F/u soon for physical

## 2015-01-18 NOTE — Patient Instructions (Signed)
   Begin Diclofenac twice daily for pain and inflammation.   Take this with food  For worse pain, you may use the Hydrocodone, either at bedtime or at most 3 times daily for severe pain.  Caution - drowsiness  Try to use ice twice daily to the elbow such as a bag of frozen peas for 20 minutes each  Use the tennis elbow strap  Consider using an arm sling to immobilize the arm for periods of time, such as 1-2 hours at a times

## 2015-01-24 ENCOUNTER — Other Ambulatory Visit: Payer: Self-pay | Admitting: Family Medicine

## 2015-01-24 DIAGNOSIS — Z1211 Encounter for screening for malignant neoplasm of colon: Secondary | ICD-10-CM

## 2015-01-24 NOTE — Telephone Encounter (Signed)
Orders are in EPIC and [facility] will contact the patient for his appointment

## 2015-01-25 ENCOUNTER — Encounter: Payer: Self-pay | Admitting: Internal Medicine

## 2015-03-09 ENCOUNTER — Ambulatory Visit (AMBULATORY_SURGERY_CENTER): Payer: Self-pay | Admitting: *Deleted

## 2015-03-09 VITALS — Ht 66.0 in | Wt 184.4 lb

## 2015-03-09 DIAGNOSIS — Z1211 Encounter for screening for malignant neoplasm of colon: Secondary | ICD-10-CM

## 2015-03-09 MED ORDER — NA SULFATE-K SULFATE-MG SULF 17.5-3.13-1.6 GM/177ML PO SOLN
1.0000 | Freq: Once | ORAL | Status: DC
Start: 2015-03-09 — End: 2015-03-23

## 2015-03-09 NOTE — Progress Notes (Signed)
No egg or soy allergy No issues with past sedation No diet pills No home 02 emmi video to e mail

## 2015-03-23 ENCOUNTER — Encounter: Payer: Self-pay | Admitting: Internal Medicine

## 2015-03-23 ENCOUNTER — Ambulatory Visit (AMBULATORY_SURGERY_CENTER): Payer: BLUE CROSS/BLUE SHIELD | Admitting: Internal Medicine

## 2015-03-23 VITALS — BP 130/92 | HR 74 | Temp 97.6°F | Resp 24 | Ht 66.0 in | Wt 184.0 lb

## 2015-03-23 DIAGNOSIS — Z1211 Encounter for screening for malignant neoplasm of colon: Secondary | ICD-10-CM

## 2015-03-23 HISTORY — PX: COLONOSCOPY: SHX174

## 2015-03-23 MED ORDER — SODIUM CHLORIDE 0.9 % IV SOLN: 500.0000 mL | INTRAVENOUS | Status: DC

## 2015-03-23 NOTE — Patient Instructions (Signed)
YOU HAD AN ENDOSCOPIC PROCEDURE TODAY AT [facility]:   Refer to the procedure report that was given to you for any specific questions about what was found during the examination.  If the procedure report does not answer your questions, please call your gastroenterologist to clarify.  If you requested that your care partner not be given the details of your procedure findings, then the procedure report has been included in a sealed envelope for you to review at your convenience later.  YOU SHOULD EXPECT: Some feelings of bloating in the abdomen. Passage of more gas than usual.  Walking can help get rid of the air that was put into your GI tract during the procedure and reduce the bloating. If you had a lower endoscopy (such as a colonoscopy or flexible sigmoidoscopy) you may notice spotting of blood in your stool or on the toilet paper. If you underwent a bowel prep for your procedure, you may not have a normal bowel movement for a few days.  Please Note:  You might notice some irritation and congestion in your nose or some drainage.  This is from the oxygen used during your procedure.  There is no need for concern and it should clear up in a day or so.  SYMPTOMS TO REPORT IMMEDIATELY:   Following lower endoscopy (colonoscopy or flexible sigmoidoscopy):  Excessive amounts of blood in the stool  Significant tenderness or worsening of abdominal pains  Swelling of the abdomen that is new, acute  Fever of 100F or higher  For urgent or emergent issues, a gastroenterologist can be reached at any hour by calling [phone].   DIET: Your first meal following the procedure should be a small meal and then it is ok to progress to your normal diet. Heavy or fried foods are harder to digest and may make you feel nauseous or bloated.  Likewise, meals heavy in dairy and vegetables can increase bloating.  Drink plenty of fluids but you should avoid alcoholic beverages for 24  hours.  ACTIVITY:  You should plan to take it easy for the rest of today and you should NOT DRIVE or use heavy machinery until tomorrow (because of the sedation medicines used during the test).    FOLLOW UP: Our staff will call the number listed on your records the next business day following your procedure to check on you and address any questions or concerns that you may have regarding the information given to you following your procedure. If we do not reach you, we will leave a message.  However, if you are feeling well and you are not experiencing any problems, there is no need to return our call.  We will assume that you have returned to your regular daily activities without incident.  If any biopsies were taken you will be contacted by phone or by letter within the next 1-3 weeks.  Please call us at [phone] if you have not heard about the biopsies in 3 weeks.    SIGNATURES/CONFIDENTIALITY: You and/or your care partner have signed paperwork which will be entered into your electronic medical record.  These signatures attest to the fact that that the information above on your After Visit Summary has been reviewed and is understood.  Full responsibility of the confidentiality of this discharge information lies with you and/or your care-partner.  Repeat Colonoscopy in 10 years

## 2015-03-23 NOTE — Progress Notes (Signed)
Report to PACU, RN, vss, BBS= Clear.  

## 2015-03-23 NOTE — Progress Notes (Signed)
Pt elevated blood pressure on admission.  Josh Monday, CRNA was made aware of b/p values.  Proceeding with colonoscopy as scheduled. maw

## 2015-03-23 NOTE — Op Note (Signed)
[place]  [address] [place], [id number]   COLONOSCOPY PROCEDURE REPORT  PATIENT: Hayden Huang, Hayden Huang  MR#: [id number] BIRTHDATE: 1961/06/03 , 77  yrs. old GENDER: male ENDOSCOPIST: Eustace Quail, MD REFERRED FS:FSELT Tysinger, PA-C PROCEDURE DATE:  03/23/2015 PROCEDURE:   Colonoscopy, screening First Screening Colonoscopy - Avg.  risk and is 50 yrs.  old or older Yes.  Prior Negative Screening - Now for repeat screening. N/A  History of Adenoma - Now for follow-up colonoscopy & has been > or = to 3 yrs.  N/A  Polyps removed today? No Recommend repeat exam, <10 yrs? No ASA CLASS:   Class II INDICATIONS:Screening for colonic neoplasia and Colorectal Neoplasm Risk Assessment for this procedure is average risk. MEDICATIONS: Monitored anesthesia care and Propofol 300 mg IV  DESCRIPTION OF PROCEDURE:   After the risks benefits and alternatives of the procedure were thoroughly explained, informed consent was obtained.  The digital rectal exam revealed no abnormalities of the rectum.   The LB [id number]  endoscope was introduced through the anus and advanced to the cecum, which was identified by both the appendix and ileocecal valve. No adverse events experienced.   The quality of the prep was excellent. (Suprep was used)  The instrument was then slowly withdrawn as the colon was fully examined. Estimated blood loss is zero unless otherwise noted in this procedure report.    COLON FINDINGS: A normal appearing cecum, ileocecal valve, and appendiceal orifice were identified.  The ascending, transverse, descending, sigmoid colon, and rectum appeared unremarkable. Retroflexed views revealed internal hemorrhoids. The time to cecum = 2.4 Withdrawal time = 12.4   The scope was withdrawn and the procedure completed. COMPLICATIONS: There were no immediate complications.  ENDOSCOPIC IMPRESSION: Normal colonoscopy  RECOMMENDATIONS: Continue current colorectal  screening recommendations for "routine risk" patients with a repeat colonoscopy in 10 years.  eSigned:  Eustace Quail, MD 03/23/2015 11:14 AM   cc: The Patient  , D. Dorothea Ogle, PAC

## 2015-03-24 ENCOUNTER — Telehealth: Payer: Self-pay | Admitting: *Deleted

## 2015-03-24 NOTE — Telephone Encounter (Signed)
  Follow up Call-  Call back number 03/23/2015  Post procedure Call Back phone  # [phone] cell  Permission to leave phone message Yes     Patient questions:  Do you have a fever, pain , or abdominal swelling? No. Pain Score  0 *  Have you tolerated food without any problems? Yes.    Have you been able to return to your normal activities? Yes.    Do you have any questions about your discharge instructions: Diet   No. Medications  No. Follow up visit  No.  Do you have questions or concerns about your Care? No.  Actions: * If pain score is 4 or above: No action needed, pain <4.

## 2015-09-18 ENCOUNTER — Ambulatory Visit (INDEPENDENT_AMBULATORY_CARE_PROVIDER_SITE_OTHER): Payer: BLUE CROSS/BLUE SHIELD | Admitting: Medical

## 2015-09-18 ENCOUNTER — Encounter: Payer: Self-pay | Admitting: Medical

## 2015-09-18 VITALS — BP 118/80 | HR 76 | Wt 185.0 lb

## 2015-09-18 DIAGNOSIS — R12 Heartburn: Secondary | ICD-10-CM | POA: Diagnosis not present

## 2015-09-18 NOTE — Progress Notes (Signed)
Subjective: Chief Complaint  Patient presents with  . Heartburn    states it could be from certain foods. taking no medications for it   Here for c/o heartburn.   He notes that he likes spicy foods, pork, red meat, but they give him heartburn.  Last had heartburn last week after eating pork.  Had heartburn lasting hours, increased belching, but didn't use any medications for this.   In general gets heartburn every now and then, mostly can tell which foods will aggravate it.  Denies heartburn with chicken or [place].   Denies radiating chest pain, SOB, wheezing, edema, paresthesias or other worrisome symptoms.   Otherwise been in usual state of health.  No other aggravating or relieving factors. No other complaint.  Past Medical History  Diagnosis Date  . Allergic rhinitis   . Dyslipidemia   . Heart murmur   . TIA (transient ischemic attack) 7/14  . Sezary syndrome ([place])   . Mycosis fungoides lymphoma ([place])     cutaneous T-cell lymphoma; in remission, sees oncology yearly  . Abnormal MRA, brain 02/23/13    26mm small superior hypophoseal infundibulum (favored) vs aneurysm  . History of MRI of brain and brain stem 02/23/13    Normal noncontrast MRI appearance of the brain. Incidental small right middle cranial fossa arachnoid cyst confirmed  . H/O echocardiogram 7/14    TTE, normal LV function, concentric LV moderate hypertrophy, 65-70% EF  . H/O diagnostic ultrasound 8/14    carotid, normal  . Hyperlipidemia   . Allergy   . Stroke ([place])     TIA 02-2013   ROS as in subjective  Objective: BP 118/80 mmHg  Pulse 76  Wt 185 lb (83.915 kg)  SpO2 98%  General appearance: alert, no distress, WD/WN  Oral cavity: MMM, no lesions Neck: supple, no lymphadenopathy, no thyromegaly, no masses Heart: RRR, normal S1, S2, no murmurs Lungs: CTA bilaterally, no wheezes, rhonchi, or rales Abdomen: +bs, soft, non tender, non distended, no masses, no hepatomegaly, no splenomegaly Pulses: 2+ symmetric,  upper and lower extremities, normal cap refill    Assessment: Encounter Diagnosis  Name Primary?  . Heartburn Yes    Plan: Gave samples of Nexium and Dexilant, advised once daily during symptoms or use on prn basis, keep Tums around for prn use, avoid trigger foods as discussed.   strongly advised he return soon for physical, fasting labs, needs to be back on statin as well for secondary prevention.

## 2015-09-22 ENCOUNTER — Ambulatory Visit (INDEPENDENT_AMBULATORY_CARE_PROVIDER_SITE_OTHER): Payer: BLUE CROSS/BLUE SHIELD | Admitting: Medical

## 2015-09-22 ENCOUNTER — Encounter: Payer: Self-pay | Admitting: Medical

## 2015-09-22 ENCOUNTER — Other Ambulatory Visit: Payer: Self-pay | Admitting: Medical

## 2015-09-22 VITALS — BP 128/78 | HR 64 | Ht 66.25 in | Wt 183.0 lb

## 2015-09-22 DIAGNOSIS — Z23 Encounter for immunization: Secondary | ICD-10-CM | POA: Diagnosis not present

## 2015-09-22 DIAGNOSIS — Z0000 Encounter for general adult medical examination without abnormal findings: Secondary | ICD-10-CM | POA: Diagnosis not present

## 2015-09-22 DIAGNOSIS — R9089 Other abnormal findings on diagnostic imaging of central nervous system: Secondary | ICD-10-CM

## 2015-09-22 DIAGNOSIS — C8415 Sezary disease, lymph nodes of inguinal region and lower limb: Secondary | ICD-10-CM | POA: Insufficient documentation

## 2015-09-22 DIAGNOSIS — J309 Allergic rhinitis, unspecified: Secondary | ICD-10-CM | POA: Diagnosis not present

## 2015-09-22 DIAGNOSIS — C8410 Sezary disease, unspecified site: Secondary | ICD-10-CM | POA: Insufficient documentation

## 2015-09-22 DIAGNOSIS — Z125 Encounter for screening for malignant neoplasm of prostate: Secondary | ICD-10-CM | POA: Diagnosis not present

## 2015-09-22 DIAGNOSIS — Z2821 Immunization not carried out because of patient refusal: Secondary | ICD-10-CM

## 2015-09-22 DIAGNOSIS — R7301 Impaired fasting glucose: Secondary | ICD-10-CM | POA: Diagnosis not present

## 2015-09-22 DIAGNOSIS — R930 Abnormal findings on diagnostic imaging of skull and head, not elsewhere classified: Secondary | ICD-10-CM | POA: Diagnosis not present

## 2015-09-22 DIAGNOSIS — E785 Hyperlipidemia, unspecified: Secondary | ICD-10-CM | POA: Diagnosis not present

## 2015-09-22 DIAGNOSIS — C8405 Mycosis fungoides, lymph nodes of inguinal region and lower limb: Secondary | ICD-10-CM | POA: Diagnosis not present

## 2015-09-22 DIAGNOSIS — Z1211 Encounter for screening for malignant neoplasm of colon: Secondary | ICD-10-CM | POA: Insufficient documentation

## 2015-09-22 DIAGNOSIS — Z8673 Personal history of transient ischemic attack (TIA), and cerebral infarction without residual deficits: Secondary | ICD-10-CM | POA: Insufficient documentation

## 2015-09-22 LAB — CBC WITH DIFFERENTIAL/PLATELET
BASOS ABS: 0.1 10*3/uL (ref 0.0–0.1)
Basophils Relative: 1 % (ref 0–1)
EOS PCT: 3 % (ref 0–5)
Eosinophils Absolute: 0.2 10*3/uL (ref 0.0–0.7)
HEMATOCRIT: 48.9 % (ref 39.0–52.0)
HEMOGLOBIN: 16.6 g/dL (ref 13.0–17.0)
LYMPHS ABS: 1.7 10*3/uL (ref 0.7–4.0)
LYMPHS PCT: 30 % (ref 12–46)
MCH: 31.2 pg (ref 26.0–34.0)
MCHC: 33.9 g/dL (ref 30.0–36.0)
MCV: 91.9 fL (ref 78.0–100.0)
MPV: 10.4 fL (ref 8.6–12.4)
Monocytes Absolute: 0.4 10*3/uL (ref 0.1–1.0)
Monocytes Relative: 8 % (ref 3–12)
NEUTROS ABS: 3.2 10*3/uL (ref 1.7–7.7)
Neutrophils Relative %: 58 % (ref 43–77)
Platelets: 203 10*3/uL (ref 150–400)
RBC: 5.32 MIL/uL (ref 4.22–5.81)
RDW: 14.9 % (ref 11.5–15.5)
WBC: 5.5 10*3/uL (ref 4.0–10.5)

## 2015-09-22 LAB — LIPID PANEL
CHOLESTEROL: 190 mg/dL (ref 125–200)
HDL: 48 mg/dL (ref >=40)
LDL Cholesterol: 128 mg/dL (ref <130)
Total CHOL/HDL Ratio: 4 Ratio (ref <=5.0)
Triglycerides: 72 mg/dL (ref <150)
VLDL: 14 mg/dL (ref <30)

## 2015-09-22 LAB — COMPREHENSIVE METABOLIC PANEL
ALT: 17 U/L (ref 9–46)
AST: 18 U/L (ref 10–35)
Albumin: 4.4 g/dL (ref 3.6–5.1)
Alkaline Phosphatase: 53 U/L (ref 40–115)
BILIRUBIN TOTAL: 0.5 mg/dL (ref 0.2–1.2)
BUN: 13 mg/dL (ref 7–25)
CO2: 28 mmol/L (ref 20–31)
CREATININE: 1.06 mg/dL (ref 0.70–1.33)
Calcium: 9.3 mg/dL (ref 8.6–10.3)
Chloride: 105 mmol/L (ref 98–110)
Glucose, Bld: 99 mg/dL (ref 65–99)
Potassium: 4.2 mmol/L (ref 3.5–5.3)
SODIUM: 142 mmol/L (ref 135–146)
Total Protein: 7.3 g/dL (ref 6.1–8.1)

## 2015-09-22 LAB — TSH: TSH: 0.313 u[IU]/mL — ABNORMAL LOW (ref 0.350–4.500)

## 2015-09-22 NOTE — Addendum Note (Signed)
Addended by: Minette Headland A on: 09/22/2015 09:21 AM   Modules accepted: Orders

## 2015-09-22 NOTE — Progress Notes (Signed)
Subjective:   HPI  Hayden Huang is a 55 y.o. male who presents for a complete physical.  Concerns: none  Reviewed their medical, surgical, family, social, medication, and allergy history and updated chart as appropriate.  Past Medical History  Diagnosis Date  . Allergic rhinitis   . Dyslipidemia   . Heart murmur   . TIA (transient ischemic attack) 7/14  . Sezary syndrome ([place])   . Mycosis fungoides lymphoma ([place])     cutaneous T-cell lymphoma; in remission, sees oncology yearly  . Abnormal MRA, brain 02/23/13    18mm small superior hypophoseal infundibulum (favored) vs aneurysm  . History of MRI of brain and brain stem 02/23/13    Normal noncontrast MRI appearance of the brain. Incidental small right middle cranial fossa arachnoid cyst confirmed  . H/O echocardiogram 7/14    TTE, normal LV function, concentric LV moderate hypertrophy, 65-70% EF  . H/O diagnostic ultrasound 8/14    carotid, normal  . Hyperlipidemia   . Allergy   . Stroke [facility])     TIA 02-2013    Past Surgical History  Procedure Laterality Date  . Wisdom tooth extraction      with sedation  . Colonoscopy  03/23/15    normal, repeat 2026; Dr. Scarlette Shorts    Social History   Social History  . Marital Status: Single    Spouse Name: N/A  . Number of Children: N/A  . Years of Education: N/A   Occupational History  . Not on file.   Social History Main Topics  . Smoking status: Never Smoker   . Smokeless tobacco: Never Used  . Alcohol Use: 3.0 oz/week    5 Cans of beer per week     Comment: occ  . Drug Use: No  . Sexual Activity: Not on file   Other Topics Concern  . Not on file   Social History Narrative   Lives in a house with his wife and three kids.  No stairs.  Works as an Optician, dispensing pumps.  Walks at work for exercise. [place].    Family History  Problem Relation Age of Onset  . High blood pressure    . Diabetes Father   . Hypertension Father   . Stroke Father   . Diabetes  Brother   . Prostate cancer Paternal Grandfather   . Cancer Maternal Grandmother     liver  . Hyperlipidemia Mother   . Hypertension Mother   . Other Brother     brain injury s/p MVA  . Colon cancer Neg Hx   . Rectal cancer Neg Hx   . Stomach cancer Neg Hx   . Esophageal cancer Neg Hx      Current outpatient prescriptions:  .  aspirin 81 MG EC tablet, Take 1 tablet (81 mg total) by mouth daily. Swallow whole., Disp: 30 tablet, Rfl: 0 .  beclomethasone (BECONASE-AQ) 42 MCG/SPRAY nasal spray, Place 2 sprays into the nose 2 (two) times daily as needed (sinus allergies). Reported on 09/18/2015, Disp: , Rfl:  .  Benzoyl Peroxide (PR BENZOYL PEROXIDE WASH) 7 % LIQD, Reported on 09/18/2015, Disp: , Rfl:  .  HYDROcodone-acetaminophen (NORCO/VICODIN) 5-325 MG per tablet, Take 1 tablet by mouth every 6 (six) hours as needed for moderate pain., Disp: 20 tablet, Rfl: 0 .  hydrocortisone cream 1 %, Apply topically. Reported on 09/18/2015, Disp: , Rfl:  .  Mechlorethamine HCl (VALCHLOR) 0.016 % GEL, Apply 1 Application topically 3 (three) times a  week., Disp: , Rfl:   No Known Allergies   Review of Systems Constitutional: -fever, -chills, -sweats, -unexpected weight change, -decreased appetite, -fatigue Allergy: -sneezing, -itching, -congestion Dermatology: -changing moles, --rash, -lumps ENT: -runny nose, -ear pain, -sore throat, -hoarseness, -sinus pain, -teeth pain, - ringing in ears, -hearing loss, -nosebleeds Cardiology: -chest pain, -palpitations, -swelling, -difficulty breathing when lying flat, -waking up short of breath Respiratory: -cough, -shortness of breath, -difficulty breathing with exercise or exertion, -wheezing, -coughing up blood Gastroenterology: -abdominal pain, -nausea, -vomiting, -diarrhea, -constipation, -blood in stool, -changes in bowel movement, -difficulty swallowing or eating Hematology: -bleeding, -bruising  Musculoskeletal: -joint aches, -muscle aches, -joint  swelling, -back pain, -neck pain, -cramping, -changes in gait Ophthalmology: denies vision changes, eye redness, itching, discharge Urology: -burning with urination, -difficulty urinating, -blood in urine, -urinary frequency, -urgency, -incontinence Neurology: -headache, -weakness, -tingling, -numbness, -memory loss, -falls, -dizziness Psychology: -depressed mood, -agitation, -sleep problems     Objective:   Physical Exam  BP 128/78 mmHg  Pulse 64  Ht 5' 6.25" (1.683 m)  Wt 183 lb (83.008 kg)  BMI 29.31 kg/m2  General appearance: alert, no distress, WD/WN, AA male Skin: right upper lateral thigh and left buttock with areas of rough pink skin and some hypopigmentation, otherwise no worrisome lesions HEENT: normocephalic, conjunctiva/corneas normal, sclerae anicteric, PERRLA, EOMi, nares patent, no discharge or erythema, pharynx normal Oral cavity: MMM, tongue normal, teeth in good repair Neck: supple, no lymphadenopathy, no thyromegaly, no masses, normal ROM, no bruits Chest: non tender, normal shape and expansion Heart: RRR, normal S1, S2, no murmurs Lungs: CTA bilaterally, no wheezes, rhonchi, or rales Abdomen: +bs, soft, non tender, non distended, no masses, no hepatomegaly, no splenomegaly, no bruits Back: non tender, normal ROM, no scoliosis Musculoskeletal: upper extremities non tender, no obvious deformity, normal ROM throughout, lower extremities non tender, no obvious deformity, normal ROM throughout Extremities: no edema, no cyanosis, no clubbing Pulses: 2+ symmetric, upper and lower extremities, normal cap refill Neurological: alert, oriented x 3, CN2-12 intact, strength normal upper extremities and lower extremities, sensation normal throughout, DTRs 2+ throughout, no cerebellar signs, gait normal Psychiatric: normal affect, behavior normal, pleasant  GU: normal male external genitalia, circumcised, nontender, no masses, no hernia, no lymphadenopathy Rectal: anus normal  tone, prostate WNL   Adult ECG Report  Indication: hx/o TIA  Rate: 69 bpm  Rhythm: normal sinus rhythm  QRS Axis: 11 degrees  PR Interval: 123ms  QRS Duration: 1ms  QTc: 459ms  Conduction Disturbances: none  Other Abnormalities: none  Patient's cardiac risk factors are: dyslipidemia, male gender and hx/o TIA.  EKG comparison: 2014  Narrative Interpretation: no change    Assessment and Plan :    Encounter Diagnoses  Name Primary?  . Encounter for health maintenance examination in adult Yes  . Allergic rhinitis, unspecified allergic rhinitis type   . Dyslipidemia   . 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 History of TIA (transient ischemic attack)   . Screening for prostate cancer   . Abnormal brain MRI   . Impaired fasting blood sugar   . Need for Tdap vaccination   . Influenza vaccine refused   . Sezary's disease, unspecified body region [facility])    Physical exam - discussed healthy lifestyle, diet, exercise, preventative care, vaccinations, and addressed their concerns.   Routine labs today EKG reviewed C/t with dermatology for Sezary disease Discussed prior MRI, may consider repeat but seems low benefit at this point Counseled on the Tdap (  tetanus, diptheria, and acellular pertussis) vaccine.  Vaccine information sheet given. Tdap vaccine given after consent obtained. Follow-up pending labs

## 2015-09-23 LAB — PSA: PSA: 0.5 ng/mL (ref <=4.00)

## 2015-09-23 LAB — HEMOGLOBIN A1C
Hgb A1c MFr Bld: 6 % — ABNORMAL HIGH (ref <5.7)
MEAN PLASMA GLUCOSE: 126 mg/dL — AB (ref <117)

## 2015-09-24 ENCOUNTER — Other Ambulatory Visit: Payer: Self-pay | Admitting: Medical

## 2015-09-24 DIAGNOSIS — E785 Hyperlipidemia, unspecified: Secondary | ICD-10-CM

## 2015-09-24 MED ORDER — PRAVASTATIN SODIUM 20 MG PO TABS: 20.0000 mg | ORAL_TABLET | Freq: Every day | ORAL | Status: DC

## 2015-09-26 LAB — T4, FREE: FREE T4: 1.1 ng/dL (ref 0.8–1.8)

## 2016-09-24 ENCOUNTER — Ambulatory Visit (INDEPENDENT_AMBULATORY_CARE_PROVIDER_SITE_OTHER): Payer: BLUE CROSS/BLUE SHIELD | Admitting: Medical

## 2016-09-24 ENCOUNTER — Encounter: Payer: Self-pay | Admitting: Medical

## 2016-09-24 VITALS — BP 128/80 | HR 74 | Ht 67.5 in | Wt 182.4 lb

## 2016-09-24 DIAGNOSIS — R7301 Impaired fasting glucose: Secondary | ICD-10-CM | POA: Diagnosis not present

## 2016-09-24 DIAGNOSIS — R946 Abnormal results of thyroid function studies: Secondary | ICD-10-CM

## 2016-09-24 DIAGNOSIS — C8415 Sezary disease, lymph nodes of inguinal region and lower limb: Secondary | ICD-10-CM

## 2016-09-24 DIAGNOSIS — Z0000 Encounter for general adult medical examination without abnormal findings: Secondary | ICD-10-CM | POA: Diagnosis not present

## 2016-09-24 DIAGNOSIS — Z8673 Personal history of transient ischemic attack (TIA), and cerebral infarction without residual deficits: Secondary | ICD-10-CM

## 2016-09-24 DIAGNOSIS — J309 Allergic rhinitis, unspecified: Secondary | ICD-10-CM

## 2016-09-24 DIAGNOSIS — E785 Hyperlipidemia, unspecified: Secondary | ICD-10-CM

## 2016-09-24 DIAGNOSIS — N400 Enlarged prostate without lower urinary tract symptoms: Secondary | ICD-10-CM | POA: Insufficient documentation

## 2016-09-24 DIAGNOSIS — R7989 Other specified abnormal findings of blood chemistry: Secondary | ICD-10-CM

## 2016-09-24 LAB — COMPREHENSIVE METABOLIC PANEL
ALBUMIN: 4.6 g/dL (ref 3.6–5.1)
ALT: 19 U/L (ref 9–46)
AST: 17 U/L (ref 10–35)
Alkaline Phosphatase: 58 U/L (ref 40–115)
BUN: 11 mg/dL (ref 7–25)
CHLORIDE: 104 mmol/L (ref 98–110)
CO2: 26 mmol/L (ref 20–31)
CREATININE: 1.07 mg/dL (ref 0.70–1.33)
Calcium: 9.5 mg/dL (ref 8.6–10.3)
Glucose, Bld: 96 mg/dL (ref 65–99)
POTASSIUM: 4.5 mmol/L (ref 3.5–5.3)
SODIUM: 140 mmol/L (ref 135–146)
TOTAL PROTEIN: 7.3 g/dL (ref 6.1–8.1)
Total Bilirubin: 0.5 mg/dL (ref 0.2–1.2)

## 2016-09-24 LAB — POCT URINALYSIS DIPSTICK
Bilirubin, UA: NEGATIVE
Blood, UA: NEGATIVE
GLUCOSE UA: NEGATIVE
Ketones, UA: NEGATIVE
Leukocytes, UA: NEGATIVE
Nitrite, UA: NEGATIVE
Protein, UA: NEGATIVE
UROBILINOGEN UA: NEGATIVE
pH, UA: 6

## 2016-09-24 LAB — LIPID PANEL
CHOLESTEROL: 173 mg/dL (ref <200)
HDL: 53 mg/dL (ref >40)
LDL Cholesterol: 107 mg/dL — ABNORMAL HIGH (ref <100)
Total CHOL/HDL Ratio: 3.3 Ratio (ref <5.0)
Triglycerides: 65 mg/dL (ref <150)
VLDL: 13 mg/dL (ref <30)

## 2016-09-24 LAB — HEMOGLOBIN A1C
HEMOGLOBIN A1C: 5.6 % (ref <5.7)
MEAN PLASMA GLUCOSE: 114 mg/dL

## 2016-09-24 LAB — CBC
HEMATOCRIT: 49.1 % (ref 38.5–50.0)
Hemoglobin: 16.6 g/dL (ref 13.2–17.1)
MCH: 31.2 pg (ref 27.0–33.0)
MCHC: 33.8 g/dL (ref 32.0–36.0)
MCV: 92.3 fL (ref 80.0–100.0)
MPV: 10.3 fL (ref 7.5–12.5)
Platelets: 192 10*3/uL (ref 140–400)
RBC: 5.32 MIL/uL (ref 4.20–5.80)
RDW: 14.8 % (ref 11.0–15.0)
WBC: 5.2 10*3/uL (ref 4.0–10.5)

## 2016-09-24 LAB — T4, FREE: Free T4: 1.2 ng/dL (ref 0.8–1.8)

## 2016-09-24 LAB — TSH: TSH: 0.45 m[IU]/L (ref 0.40–4.50)

## 2016-09-24 LAB — PSA: PSA: 0.4 ng/mL (ref <=4.0)

## 2016-09-24 NOTE — Progress Notes (Signed)
Subjective:   HPI  Hayden Huang is a 56 y.o. male who presents for a complete physical.  Concerns: Urinary pressure seems down.   Notices change in urine stream last few weeks.  No straining.   No urinary dribbling.  Gets up to urinate once at night to urinate, sometimes twice.  Stream weaker than in the past.     Declines flu shot  Reviewed their medical, surgical, family, social, medication, and allergy history and updated chart as appropriate.  Past Medical History:  Diagnosis Date  . Abnormal MRA, brain 02/23/13   63mm small superior hypophoseal infundibulum (favored) vs aneurysm  . Allergic rhinitis   . Allergy   . Dyslipidemia   . H/O diagnostic ultrasound 8/14   carotid, normal  . H/O echocardiogram 7/14   TTE, normal LV function, concentric LV moderate hypertrophy, 65-70% EF  . Heart murmur   . History of MRI of brain and brain stem 02/23/13   Normal noncontrast MRI appearance of the brain. Incidental small right middle cranial fossa arachnoid cyst confirmed  . Hyperlipidemia   . 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 Stroke [facility])    TIA 02-2013  . TIA (transient ischemic attack) 7/14    Past Surgical History:  Procedure Laterality Date  . COLONOSCOPY  03/23/15   normal, repeat 2026; Dr. Scarlette Shorts  . WISDOM TOOTH EXTRACTION     with sedation    Social History   Social History  . Marital status: Single    Spouse name: N/A  . Number of children: N/A  . Years of education: N/A   Occupational History  . Not on file.   Social History Main Topics  . Smoking status: Never Smoker  . Smokeless tobacco: Never Used  . Alcohol use 3.0 oz/week    5 Cans of beer per week     Comment: occ  . Drug use: No  . Sexual activity: Not on file   Other Topics Concern  . Not on file   Social History Narrative   Lives in a house with his wife and three kids.  No stairs.  Works as an  Optician, dispensing pumps.  Walks at work for exercise. [place].    Family History  Problem Relation Age of Onset  . Diabetes Father   . Hypertension Father   . Stroke Father   . Diabetes Brother   . Hyperlipidemia Mother   . Hypertension Mother   . Other Brother     brain injury s/p MVA  . High blood pressure    . Prostate cancer Paternal Grandfather   . Cancer Maternal Grandmother     liver  . Colon cancer Neg Hx   . Rectal cancer Neg Hx   . Stomach cancer Neg Hx   . Esophageal cancer Neg Hx      Current Outpatient Prescriptions:  .  aspirin 81 MG EC tablet, Take 1 tablet (81 mg total) by mouth daily. Swallow whole., Disp: 30 tablet, Rfl: 0 .  pravastatin (PRAVACHOL) 20 MG tablet, Take 1 tablet (20 mg total) by mouth daily., Disp: 90 tablet, Rfl: 3  No Known Allergies   Review of Systems Constitutional: -fever, -chills, -sweats, -unexpected weight change, -decreased appetite, -fatigue Allergy: -sneezing, -itching, -congestion Dermatology: -changing moles, --rash, -lumps ENT: -runny nose, -ear pain, -sore throat, -hoarseness, -sinus pain, -teeth pain, - ringing in ears, -hearing loss, -  nosebleeds Cardiology: -chest pain, -palpitations, -swelling, -difficulty breathing when lying flat, -waking up short of breath Respiratory: -cough, -shortness of breath, -difficulty breathing with exercise or exertion, -wheezing, -coughing up blood Gastroenterology: -abdominal pain, -nausea, -vomiting, -diarrhea, -constipation, -blood in stool, -changes in bowel movement, -difficulty swallowing or eating Hematology: -bleeding, -bruising  Musculoskeletal: -joint aches, -muscle aches, -joint swelling, -back pain, -neck pain, -cramping, -changes in gait Ophthalmology: denies vision changes, eye redness, itching, discharge Urology: -burning with urination, -difficulty urinating, -blood in urine, -urinary frequency, -urgency, -incontinence Neurology: -headache, -weakness, -tingling, -numbness,  -memory loss, -falls, -dizziness Psychology: -depressed mood, -agitation, -sleep problems     Objective:   Physical Exam  BP 128/80   Pulse 74   Ht 5' 7.5" (1.715 m)   Wt 182 lb 6.4 oz (82.7 kg)   SpO2 97%   BMI 28.15 kg/m   General appearance: alert, no distress, WD/WN, AA male Skin: right upper lateral thigh and left buttock with areas of rough pink skin and some hypopigmentation, otherwise no worrisome lesions HEENT: normocephalic, conjunctiva/corneas normal, sclerae anicteric, PERRLA, EOMi, nares patent, no discharge or erythema, pharynx normal Oral cavity: MMM, tongue normal, teeth in good repair Neck: supple, no lymphadenopathy, no thyromegaly, no masses, normal ROM, no bruits Chest: non tender, normal shape and expansion Heart: RRR, normal S1, S2, no murmurs Lungs: CTA bilaterally, no wheezes, rhonchi, or rales Abdomen: +bs, soft, non tender, non distended, no masses, no hepatomegaly, no splenomegaly, no bruits Back: non tender, normal ROM, no scoliosis Musculoskeletal: upper extremities non tender, no obvious deformity, normal ROM throughout, lower extremities non tender, no obvious deformity, normal ROM throughout Extremities: no edema, no cyanosis, no clubbing Pulses: 2+ symmetric, upper and lower extremities, normal cap refill Neurological: alert, oriented x 3, CN2-12 intact, strength normal upper extremities and lower extremities, sensation normal throughout, DTRs 2+ throughout, no cerebellar signs, gait normal Psychiatric: normal affect, behavior normal, pleasant  GU: normal male external genitalia, circumcised, nontender, no masses, no hernia, no lymphadenopathy Rectal: anus normal tone, prostate with mild to moderate enlargement, no nodules    Assessment and Plan :    Encounter Diagnoses  Name Primary?  . Encounter for health maintenance examination in adult Yes  . Allergic rhinitis, unspecified chronicity, unspecified seasonality, unspecified trigger   .  Impaired fasting blood sugar   . 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 Dyslipidemia   . History of TIA (transient ischemic attack)   . Abnormal thyroid blood test   . BPH without urinary obstruction    Physical exam - discussed healthy lifestyle, diet, exercise, preventative care, vaccinations, and addressed their concerns.   Routine labs today C/t with dermatology for Sezary disease C/t same medication See your eye doctor yearly for routine vision care. See your dentist yearly for routine dental care including hygiene visits twice yearly. Exercise daily Begin trial of Saw Palmetto OTC for prostate enlargement symptoms Follow-up pending labs

## 2016-09-24 NOTE — Patient Instructions (Signed)
Recommendations:  Begin trial of Saw Palmetto OTC for prostate enlargement symptoms  See your eye doctor yearly for routine vision care.  See your dentist yearly for routine dental care including hygiene visits twice yearly.  See your dermatologist yearly given history of Sezary disease  Exercise - preferably every day!  Eat a healthy low fat diet, try and cut back on sodas    Benign Prostatic Hyperplasia An enlarged prostate (benign prostatic hyperplasia) is common in older men. You may experience the following:  Weak urine stream.  Dribbling.  Feeling like the bladder has not emptied completely.  Difficulty starting urination.  Getting up frequently at night to urinate.  Urinating more frequently during the day. HOME CARE INSTRUCTIONS  Monitor your prostatic hyperplasia for any changes. The following actions may help to alleviate any discomfort you are experiencing:  Give yourself time when you urinate.  Stay away from alcohol.  Avoid beverages containing caffeine, such as coffee, tea, and colas, because they can make the problem worse.  Avoid decongestants, antihistamines, and some prescription medicines that can make the problem worse.  Follow up with your health care provider for further treatment as recommended. SEEK MEDICAL CARE IF:  You are experiencing progressive difficulty voiding.  Your urine stream is progressively getting narrower.  You are awaking from sleep with the urge to void more frequently.  You are constantly feeling the need to void.  You experience loss of urine, especially in small amounts. SEEK IMMEDIATE MEDICAL CARE IF:   You develop increased pain with urination or are unable to urinate.  You develop severe abdominal pain, vomiting, a high fever, or fainting.  You develop back pain or blood in your urine. MAKE SURE YOU:   Understand these instructions.  Will watch your condition.  Will get help right away if you are not  doing well or get worse. This information is not intended to replace advice given to you by your health care provider. Make sure you discuss any questions you have with your health care provider. Document Released: 08/05/2005 Document Revised: 08/26/2014 Document Reviewed: 01/05/2013 Elsevier Interactive Patient Education  2017 [employer].

## 2016-09-25 ENCOUNTER — Other Ambulatory Visit: Payer: Self-pay | Admitting: Medical

## 2016-09-25 MED ORDER — PRAVASTATIN SODIUM 20 MG PO TABS: 20.0000 mg | ORAL_TABLET | Freq: Every day | ORAL | 3 refills | Status: DC

## 2016-09-25 MED ORDER — ASPIRIN 81 MG PO TBEC: 81.0000 mg | DELAYED_RELEASE_TABLET | Freq: Every day | ORAL | 3 refills | Status: DC

## 2016-11-20 ENCOUNTER — Other Ambulatory Visit: Payer: Self-pay | Admitting: Medical

## 2017-10-31 ENCOUNTER — Ambulatory Visit (INDEPENDENT_AMBULATORY_CARE_PROVIDER_SITE_OTHER): Payer: BLUE CROSS/BLUE SHIELD | Admitting: Family Medicine

## 2017-10-31 ENCOUNTER — Encounter: Payer: Self-pay | Admitting: Family Medicine

## 2017-10-31 VITALS — BP 152/96 | HR 85 | Temp 97.6°F | Wt 186.2 lb

## 2017-10-31 DIAGNOSIS — J0140 Acute pansinusitis, unspecified: Secondary | ICD-10-CM | POA: Diagnosis not present

## 2017-10-31 MED ORDER — AMOXICILLIN-POT CLAVULANATE 875-125 MG PO TABS: 1.0000 | ORAL_TABLET | Freq: Two times a day (BID) | ORAL | 0 refills | Status: DC

## 2017-10-31 NOTE — Progress Notes (Signed)
Chief Complaint  Patient presents with  . other    congestion, no draining,no fever per pt started about three or four weeks now.    Subjective:  Hayden Huang is a 57 y.o. male who presents for a 3-4 week history of nasal congestion, sinus pressure, sneezing and scratchy throat. He does have underlying allergies and is not currently treating them.   Denies fever, chills, body aches, dizziness, ear pain, chest pain, palpitations, shortness of breath, coughing, wheezing, abdominal pain, N/V/D, LE edema.  He is not a smoker. No recent antibiotics.    Treatment to date: mucinex, Afrin, Sinus spray, Breate right.  Denies sick contacts.  No other aggravating or relieving factors.  No other c/o.  ROS as in subjective.   Objective: Vitals:   10/31/17 1616  BP: (!) 152/96  Pulse: 85  Temp: 97.6 F (36.4 C)  SpO2: 95%    General appearance: Alert, WD/WN, no distress, mildly ill appearing                             Skin: warm, no rash                           Head: + frontal, maxillary sinus tenderness                            Eyes: conjunctiva normal, corneas clear, PERRLA                            Ears: pearly TMs, external ear canals normal                          Nose: septum midline, turbinates swollen, with erythema and thick discharge L>R             Mouth/throat: MMM, tongue normal, mild pharyngeal erythema                           Neck: supple, no adenopathy, no thyromegaly, nontender                          Heart: RRR, normal S1, S2, no murmurs                         Lungs: CTA bilaterally, no wheezes, rales, or rhonchi      Assessment: Acute non-recurrent pansinusitis - Plan: amoxicillin-clavulanate (AUGMENTIN) 875-125 MG tablet   Plan: Discussed diagnosis and treatment of acute sinusitis. Augmentin prescribed. He will continue taking a decongestant but stop using Afrin due to rebound congestion.   Suggested symptomatic OTC remedies.Nasal saline spray for  congestion.  Tylenol or Ibuprofen OTC for fever and malaise. He will follow up if not back to baseline after completing the antibiotic.    He is aware that his BP is elevated today and will check this outside of here.

## 2017-12-06 ENCOUNTER — Other Ambulatory Visit: Payer: Self-pay | Admitting: Medical

## 2017-12-08 NOTE — Telephone Encounter (Signed)
Left message for pt to call back to schedule an appt 

## 2018-04-24 ENCOUNTER — Ambulatory Visit (INDEPENDENT_AMBULATORY_CARE_PROVIDER_SITE_OTHER): Payer: BLUE CROSS/BLUE SHIELD | Admitting: Medical

## 2018-04-24 VITALS — BP 140/80 | HR 78 | Temp 97.8°F | Resp 16 | Ht 67.25 in | Wt 186.2 lb

## 2018-04-24 DIAGNOSIS — R21 Rash and other nonspecific skin eruption: Secondary | ICD-10-CM | POA: Diagnosis not present

## 2018-04-24 DIAGNOSIS — Z2821 Immunization not carried out because of patient refusal: Secondary | ICD-10-CM | POA: Diagnosis not present

## 2018-04-24 DIAGNOSIS — B880 Other acariasis: Secondary | ICD-10-CM

## 2018-04-24 MED ORDER — TRIAMCINOLONE ACETONIDE 0.1 % EX CREA: 1.0000 "application " | TOPICAL_CREAM | Freq: Two times a day (BID) | CUTANEOUS | 0 refills | Status: DC

## 2018-04-24 MED ORDER — CEPHALEXIN 500 MG PO CAPS: 500.0000 mg | ORAL_CAPSULE | Freq: Three times a day (TID) | ORAL | 0 refills | Status: DC

## 2018-04-24 NOTE — Progress Notes (Signed)
Subjective: Chief Complaint  Patient presents with  . bug bites    bug bites Chiggers X tuesday   Here for insect bites.   Was out in the woods few days ago, tending to deer feeder.   He and sone went.  He had on long pants but hours later had several bumps/bites throughout lower legs and feet, thinks he got bit by chiggers.  Rash is itchy.   Using OTC Chigger ease from [facility].   No fever, no NVD, no body aches, no chills.  No other aggravating or relieving factors. No other complaint.  Past Medical History:  Diagnosis Date  . Abnormal MRA, brain 02/23/13   50mm small superior hypophoseal infundibulum (favored) vs aneurysm  . Allergic rhinitis   . Allergy   . Dyslipidemia   . H/O diagnostic ultrasound 8/14   carotid, normal  . H/O echocardiogram 7/14   TTE, normal LV function, concentric LV moderate hypertrophy, 65-70% EF  . Heart murmur   . History of MRI of brain and brain stem 02/23/13   Normal noncontrast MRI appearance of the brain. Incidental small right middle cranial fossa arachnoid cyst confirmed  . Hyperlipidemia   . 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 Stroke [facility])    TIA 02-2013  . TIA (transient ischemic attack) 7/14   Current Outpatient Medications on File Prior to Visit  Medication Sig Dispense Refill  . aspirin 81 MG EC tablet Take 1 tablet (81 mg total) by mouth daily. Swallow whole. 90 tablet 3  . pravastatin (PRAVACHOL) 20 MG tablet Take 1 tablet (20 mg total) by mouth daily. (Patient not taking: Reported on 04/24/2018) 90 tablet 3   No current facility-administered medications on file prior to visit.    ROS as in subjective   Objective BP 140/80   Pulse 78   Temp 97.8 F (36.6 C) (Oral)   Resp 16   Ht 5' 7.25" (1.708 m)   Wt 186 lb 3.2 oz (84.5 kg)   SpO2 97%   BMI 28.95 kg/m   Gen: wd, wn, ,nad Skin: numerous papular erythematous and some unroofed  lesions 2-3 mm diameter on bilat lower legs and feet dorsal surface.  No induration, no warmth, no fluctuance.   Legs neurovascularly intact    Assessment: Encounter Diagnoses  Name Primary?  . Chigger bites Yes  . Rash   . Influenza vaccination declined     Plan: Discussed exam findings, symptoms, and recommendations below.   Discussed signs of cellulitis that would prompt call or recheck and or beginning antibiotic.   No signs of cellulitis currently. He declines flu shot. F/u soon for physical  Patient Instructions  Recommendations  Begin Triamcinolone cream topically to feet and legs for the next 4-7 days as needed  Begin over the counter Benadryl 25mg  (adult dose).  Use either 1/2 - 1 tablet in the day, 1 or up to 2 tablets (25-50mg ) at bedtime.  Caution as benadryl can make you drowsy  Keep legs clean with soap and water  Avoid hot showers until resolved  Use insect repellant and long pants next time  If any of the bites/sores look more swollen, red, and angry, then call or return.      Hayden Huang was seen today for bug bites.  Diagnoses and all orders for this visit:  Chigger bites  Rash  Influenza vaccination declined  Other orders -  triamcinolone cream (KENALOG) 0.1 %; Apply 1 application topically 2 (two) times daily. -     cephALEXin (KEFLEX) 500 MG capsule; Take 1 capsule (500 mg total) by mouth 3 (three) times daily.

## 2018-04-24 NOTE — Patient Instructions (Signed)
Recommendations  Begin Triamcinolone cream topically to feet and legs for the next 4-7 days as needed  Begin over the counter Benadryl 25mg  (adult dose).  Use either 1/2 - 1 tablet in the day, 1 or up to 2 tablets (25-50mg ) at bedtime.  Caution as benadryl can make you drowsy  Keep legs clean with soap and water  Avoid hot showers until resolved  Use insect repellant and long pants next time  If any of the bites/sores look more swollen, red, and angry, then call or return.

## 2018-05-20 ENCOUNTER — Encounter: Payer: Self-pay | Admitting: Medical

## 2018-05-20 ENCOUNTER — Ambulatory Visit (INDEPENDENT_AMBULATORY_CARE_PROVIDER_SITE_OTHER): Payer: BLUE CROSS/BLUE SHIELD | Admitting: Medical

## 2018-05-20 VITALS — BP 140/96 | HR 80 | Temp 98.0°F | Resp 16 | Ht 67.0 in | Wt 182.8 lb

## 2018-05-20 DIAGNOSIS — Z2821 Immunization not carried out because of patient refusal: Secondary | ICD-10-CM

## 2018-05-20 DIAGNOSIS — N400 Enlarged prostate without lower urinary tract symptoms: Secondary | ICD-10-CM | POA: Diagnosis not present

## 2018-05-20 DIAGNOSIS — Z7185 Encounter for immunization safety counseling: Secondary | ICD-10-CM | POA: Insufficient documentation

## 2018-05-20 DIAGNOSIS — R7989 Other specified abnormal findings of blood chemistry: Secondary | ICD-10-CM | POA: Diagnosis not present

## 2018-05-20 DIAGNOSIS — Z8249 Family history of ischemic heart disease and other diseases of the circulatory system: Secondary | ICD-10-CM | POA: Insufficient documentation

## 2018-05-20 DIAGNOSIS — Z0000 Encounter for general adult medical examination without abnormal findings: Secondary | ICD-10-CM

## 2018-05-20 DIAGNOSIS — J309 Allergic rhinitis, unspecified: Secondary | ICD-10-CM

## 2018-05-20 DIAGNOSIS — C8415 Sezary disease, lymph nodes of inguinal region and lower limb: Secondary | ICD-10-CM

## 2018-05-20 DIAGNOSIS — C8405 Mycosis fungoides, lymph nodes of inguinal region and lower limb: Secondary | ICD-10-CM

## 2018-05-20 DIAGNOSIS — Z8673 Personal history of transient ischemic attack (TIA), and cerebral infarction without residual deficits: Secondary | ICD-10-CM

## 2018-05-20 DIAGNOSIS — E785 Hyperlipidemia, unspecified: Secondary | ICD-10-CM

## 2018-05-20 DIAGNOSIS — I10 Essential (primary) hypertension: Secondary | ICD-10-CM | POA: Insufficient documentation

## 2018-05-20 DIAGNOSIS — Z7189 Other specified counseling: Secondary | ICD-10-CM

## 2018-05-20 DIAGNOSIS — R7301 Impaired fasting glucose: Secondary | ICD-10-CM | POA: Diagnosis not present

## 2018-05-20 LAB — POCT URINALYSIS DIP (PROADVANTAGE DEVICE)
Bilirubin, UA: NEGATIVE
Blood, UA: NEGATIVE
Glucose, UA: NEGATIVE mg/dL
Ketones, POC UA: NEGATIVE mg/dL
LEUKOCYTES UA: NEGATIVE
NITRITE UA: NEGATIVE
PH UA: 6 (ref 5.0–8.0)
Specific Gravity, Urine: 1.02
Urobilinogen, Ur: NEGATIVE

## 2018-05-20 MED ORDER — PRAVASTATIN SODIUM 20 MG PO TABS: 20.0000 mg | ORAL_TABLET | Freq: Every day | ORAL | 3 refills | Status: DC

## 2018-05-20 MED ORDER — ASPIRIN 81 MG PO TBEC: 81.0000 mg | DELAYED_RELEASE_TABLET | Freq: Every day | ORAL | 3 refills | Status: DC

## 2018-05-20 MED ORDER — AMLODIPINE BESYLATE 5 MG PO TABS: 5.0000 mg | ORAL_TABLET | Freq: Every day | ORAL | 3 refills | Status: DC

## 2018-05-20 NOTE — Progress Notes (Signed)
Subjective:   HPI  Hayden Huang is a 57 y.o. male who presents for a complete physical.  Medical team: Opha Mcghee, Camelia Eng, PA-C here for primary care Sees dentist, eye doctor Dr. Lubertha Sayres, dermatology Dr. Scarlette Shorts, GI  Concerns: Lately not eating as healthy, more pizza and greasy foods.  Working 80 hour weeks lately.  No chest pain, no edema, no palpations, no fatigue.  Declines flu shot  Reviewed their medical, surgical, family, social, medication, and allergy history and updated chart as appropriate.  Past Medical History:  Diagnosis Date  . Abnormal MRA, brain 02/23/13   45mm small superior hypophoseal infundibulum (favored) vs aneurysm  . Allergic rhinitis   . Allergy   . Dyslipidemia   . H/O diagnostic ultrasound 8/14   carotid, normal  . H/O echocardiogram 7/14   TTE, normal LV function, concentric LV moderate hypertrophy, 65-70% EF  . Heart murmur   . History of MRI of brain and brain stem 02/23/13   Normal noncontrast MRI appearance of the brain. Incidental small right middle cranial fossa arachnoid cyst confirmed  . Hyperlipidemia   . 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 Stroke [facility])    TIA 02-2013  . TIA (transient ischemic attack) 7/14    Past Surgical History:  Procedure Laterality Date  . COLONOSCOPY  03/23/15   normal, repeat 2026; Dr. Scarlette Shorts  . WISDOM TOOTH EXTRACTION     with sedation    Social History   Socioeconomic History  . Marital status: Single    Spouse name: Not on file  . Number of children: Not on file  . Years of education: Not on file  . Highest education level: Not on file  Occupational History  . Not on file  Social Needs  . Financial resource strain: Not on file  . Food insecurity:    Worry: Not on file    Inability: Not on file  . Transportation needs:    Medical: Not on file    Non-medical: Not on file  Tobacco Use  .  Smoking status: Never Smoker  . Smokeless tobacco: Never Used  Substance and Sexual Activity  . Alcohol use: Yes    Alcohol/week: 5.0 standard drinks    Types: 5 Cans of beer per week    Comment: occ  . Drug use: No  . Sexual activity: Not on file  Lifestyle  . Physical activity:    Days per week: Not on file    Minutes per session: Not on file  . Stress: Not on file  Relationships  . Social connections:    Talks on phone: Not on file    Gets together: Not on file    Attends religious service: Not on file    Active member of club or organization: Not on file    Attends meetings of clubs or organizations: Not on file    Relationship status: Not on file  . Intimate partner violence:    Fear of current or ex partner: Not on file    Emotionally abused: Not on file    Physically abused: Not on file    Forced sexual activity: Not on file  Other Topics Concern  . Not on file  Social History Narrative   Lives in a house with his wife and three kids.  No stairs.  Works as an Research scientist (life sciences), [employer]  at work for exercise.  05/2018.    Family History  Problem Relation Age of Onset  . Diabetes Father   . Hypertension Father   . Stroke Father   . AAA (abdominal aortic aneurysm) Father   . Diabetes Brother   . Hyperlipidemia Mother   . Hypertension Mother   . Other Brother        brain injury s/p MVA  . High blood pressure Unknown   . Prostate cancer Paternal Grandfather   . Cancer Maternal Grandmother        liver  . Colon cancer Neg Hx   . Rectal cancer Neg Hx   . Stomach cancer Neg Hx   . Esophageal cancer Neg Hx   . Heart disease Neg Hx      Current Outpatient Medications:  .  aspirin 81 MG EC tablet, Take 1 tablet (81 mg total) by mouth daily. Swallow whole., Disp: 90 tablet, Rfl: 3 .  triamcinolone cream (KENALOG) 0.1 %, Apply 1 application topically 2 (two) times daily., Disp: 30 g, Rfl: 0 .  amLODipine (NORVASC) 5 MG tablet, Take 1 tablet (5 mg  total) by mouth daily., Disp: 90 tablet, Rfl: 3 .  pravastatin (PRAVACHOL) 20 MG tablet, Take 1 tablet (20 mg total) by mouth daily., Disp: 90 tablet, Rfl: 3  No Known Allergies   Review of Systems Constitutional: -fever, -chills, -sweats, -unexpected weight change, -decreased appetite, -fatigue Allergy: -sneezing, -itching, -congestion Dermatology: -changing moles, --rash, -lumps ENT: -runny nose, -ear pain, -sore throat, -hoarseness, -sinus pain, -teeth pain, - ringing in ears, -hearing loss, -nosebleeds Cardiology: -chest pain, -palpitations, -swelling, -difficulty breathing when lying flat, -waking up short of breath Respiratory: -cough, -shortness of breath, -difficulty breathing with exercise or exertion, -wheezing, -coughing up blood Gastroenterology: -abdominal pain, -nausea, -vomiting, -diarrhea, -constipation, -blood in stool, -changes in bowel movement, -difficulty swallowing or eating Hematology: -bleeding, -bruising  Musculoskeletal: -joint aches, -muscle aches, -joint swelling, -back pain, -neck pain, -cramping, -changes in gait Ophthalmology: denies vision changes, eye redness, itching, discharge Urology: -burning with urination, -difficulty urinating, -blood in urine, -urinary frequency, -urgency, -incontinence Neurology: -headache, -weakness, -tingling, -numbness, -memory loss, -falls, -dizziness Psychology: -depressed mood, -agitation, -sleep problems     Objective:   Physical Exam  BP (!) 140/96   Pulse 80   Temp 98 F (36.7 C) (Oral)   Resp 16   Ht 5\' 7"  (1.702 m)   Wt 182 lb 12.8 oz (82.9 kg)   SpO2 97%   BMI 28.63 kg/m   General appearance: alert, no distress, WD/WN, AA male Skin: right upper lateral thigh and left buttock with areas of rough pink skin and some hypopigmentation, otherwise no worrisome lesions HEENT: normocephalic, conjunctiva/corneas normal, sclerae anicteric, PERRLA, EOMi, nares patent, no discharge or erythema, pharynx normal Oral  cavity: MMM, tongue normal, teeth in good repair Neck: supple, no lymphadenopathy, no thyromegaly, no masses, normal ROM, no bruits Chest: non tender, normal shape and expansion Heart: RRR, normal S1, S2, no murmurs Lungs: CTA bilaterally, no wheezes, rhonchi, or rales Abdomen: +bs, soft, non tender, non distended, no masses, no hepatomegaly, no splenomegaly, no bruits Back: non tender, normal ROM, no scoliosis Musculoskeletal: upper extremities non tender, no obvious deformity, normal ROM throughout, lower extremities non tender, no obvious deformity, normal ROM throughout Extremities: no edema, no cyanosis, no clubbing Pulses: 2+ symmetric, upper and lower extremities, normal cap refill Neurological: alert, oriented x 3, CN2-12 intact, strength normal upper extremities and lower extremities, sensation normal throughout, DTRs 2+  throughout, no cerebellar signs, gait normal Psychiatric: normal affect, behavior normal, pleasant  GU: normal male external genitalia, circumcised, nontender, no masses, no hernia, no lymphadenopathy Rectal: anus normal tone, prostate with mild to moderate enlargement, no nodules    Adult ECG Report  Indication: HTN  Rate: 73 bpm  Rhythm: normal sinus rhythm  QRS Axis: 18 degrees  PR Interval: 111ms  QRS Duration: 79ms  QTc: 478ms  Conduction Disturbances: none  Other Abnormalities: none  Patient's cardiac risk factors are: dyslipidemia, hypertension and male gender.  EKG comparison: 2017  Narrative Interpretation: no acute changes    Assessment and Plan :    Encounter Diagnoses  Name Primary?  . Routine general medical examination at a health care facility Yes  . Impaired fasting blood sugar   . BPH without urinary obstruction   . Abnormal thyroid blood test   . Dyslipidemia   . History of TIA (transient ischemic attack)   . Influenza vaccine refused   . 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 Allergic rhinitis, unspecified seasonality, unspecified trigger   . Family history of abdominal aortic aneurysm (AAA)   . Essential hypertension, benign   . Vaccine counseling    Physical exam - discussed healthy lifestyle, diet, exercise, preventative care, vaccinations, and addressed their concerns.   Routine labs today C/t with dermatology for Sezary disease See your eye doctor yearly for routine vision care. See your dentist yearly for routine dental care including hygiene visits twice yearly.  HTN - discussed diagnosis, treatment, lifestyle changes,  Begin amlodipine, f/u 4-6 wk  Cancer screens Will send home with stool cards PSA lab today  Shingles vaccine:  I recommend you have a shingles vaccine to help prevent shingles or herpes zoster outbreak.   Please call your insurer to inquire about coverage for the Shingrix vaccine given in 2 doses.   Some insurers cover this vaccine after age 30, some cover this after age 64.  If your insurer covers this, then call to schedule appointment to have this vaccine here. Declines flu shot  Follow-up pending labs  Consuelo was seen today for cpe.  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 Swallow  whole. -     pravastatin (PRAVACHOL) 20 MG tablet; Take 1 tablet (20 mg total) by mouth daily. -     amLODipine (NORVASC) 5 MG tablet; Take 1 tablet (5 mg total) by mouth daily.

## 2018-05-21 LAB — TSH: TSH: 0.737 u[IU]/mL (ref 0.450–4.500)

## 2018-05-21 LAB — COMPREHENSIVE METABOLIC PANEL
ALT: 20 IU/L (ref 0–44)
AST: 20 IU/L (ref 0–40)
Albumin/Globulin Ratio: 2 (ref 1.2–2.2)
Albumin: 4.8 g/dL (ref 3.5–5.5)
Alkaline Phosphatase: 67 IU/L (ref 39–117)
BUN/Creatinine Ratio: 11 (ref 9–20)
BUN: 12 mg/dL (ref 6–24)
Bilirubin Total: 0.4 mg/dL (ref 0.0–1.2)
CALCIUM: 9.6 mg/dL (ref 8.7–10.2)
CO2: 22 mmol/L (ref 20–29)
Chloride: 103 mmol/L (ref 96–106)
Creatinine, Ser: 1.13 mg/dL (ref 0.76–1.27)
GFR, EST AFRICAN AMERICAN: 84 mL/min/{1.73_m2} (ref >59)
GFR, EST NON AFRICAN AMERICAN: 72 mL/min/{1.73_m2} (ref >59)
GLUCOSE: 100 mg/dL — AB (ref 65–99)
Globulin, Total: 2.4 g/dL (ref 1.5–4.5)
POTASSIUM: 4.4 mmol/L (ref 3.5–5.2)
SODIUM: 140 mmol/L (ref 134–144)
Total Protein: 7.2 g/dL (ref 6.0–8.5)

## 2018-05-21 LAB — LIPID PANEL
Chol/HDL Ratio: 3.9 ratio (ref 0.0–5.0)
Cholesterol, Total: 217 mg/dL — ABNORMAL HIGH (ref 100–199)
HDL: 56 mg/dL (ref >39)
LDL Calculated: 143 mg/dL — ABNORMAL HIGH (ref 0–99)
Triglycerides: 88 mg/dL (ref 0–149)
VLDL CHOLESTEROL CAL: 18 mg/dL (ref 5–40)

## 2018-05-21 LAB — CBC WITH DIFFERENTIAL/PLATELET
BASOS: 1 %
Basophils Absolute: 0 10*3/uL (ref 0.0–0.2)
EOS (ABSOLUTE): 0.2 10*3/uL (ref 0.0–0.4)
Eos: 3 %
Hematocrit: 49.3 % (ref 37.5–51.0)
Hemoglobin: 16.7 g/dL (ref 13.0–17.7)
Immature Grans (Abs): 0 10*3/uL (ref 0.0–0.1)
Immature Granulocytes: 0 %
LYMPHS ABS: 1.4 10*3/uL (ref 0.7–3.1)
Lymphs: 28 %
MCH: 30.3 pg (ref 26.6–33.0)
MCHC: 33.9 g/dL (ref 31.5–35.7)
MCV: 90 fL (ref 79–97)
MONOS ABS: 0.5 10*3/uL (ref 0.1–0.9)
Monocytes: 10 %
Neutrophils Absolute: 3 10*3/uL (ref 1.4–7.0)
Neutrophils: 58 %
PLATELETS: 195 10*3/uL (ref 150–450)
RBC: 5.51 x10E6/uL (ref 4.14–5.80)
RDW: 14.2 % (ref 12.3–15.4)
WBC: 5.1 10*3/uL (ref 3.4–10.8)

## 2018-05-21 LAB — PSA: PROSTATE SPECIFIC AG, SERUM: 0.9 ng/mL (ref 0.0–4.0)

## 2018-05-21 LAB — T4, FREE: FREE T4: 1.11 ng/dL (ref 0.82–1.77)

## 2018-05-21 LAB — HEMOGLOBIN A1C
Est. average glucose Bld gHb Est-mCnc: 123 mg/dL
Hgb A1c MFr Bld: 5.9 % — ABNORMAL HIGH (ref 4.8–5.6)

## 2018-06-17 ENCOUNTER — Ambulatory Visit (INDEPENDENT_AMBULATORY_CARE_PROVIDER_SITE_OTHER): Payer: BLUE CROSS/BLUE SHIELD | Admitting: Medical

## 2018-06-17 VITALS — BP 130/86 | HR 86 | Temp 98.1°F | Resp 16 | Ht 67.0 in | Wt 184.4 lb

## 2018-06-17 DIAGNOSIS — Z7189 Other specified counseling: Secondary | ICD-10-CM

## 2018-06-17 DIAGNOSIS — I10 Essential (primary) hypertension: Secondary | ICD-10-CM | POA: Diagnosis not present

## 2018-06-17 DIAGNOSIS — Z7185 Encounter for immunization safety counseling: Secondary | ICD-10-CM

## 2018-06-17 NOTE — Patient Instructions (Signed)
Recommendations  Continue amlodipine 5 mg daily for blood pressure  Check your blood pressure a few times per week  Goal is 120/70  Increase your exercise, aim for at least 150 minutes of exercise per week such as walking  Limit salt, look at food choices that are lower sodium and overall healthier in general  Come on in for that shingles shot soon.   Call for appointment to make sure we have it in stock  Call or My Chart in 4-6 weeks to give me update on weight and BP readings   I recommend exercising most days of the week using a type of exercise that they would enjoy and stick to such as walking, running, swimming, hiking, biking, aerobics, etc.  I recommend a healthy diet.    Do's:   whole grains such as whole grain pasta, rice, whole grains breads and whole grain cereals.  Use small quantities such as 1/2 cup per serving or 2 slices of bread per serving.    Eat 3-5 fruits daily  Eat beans at least once daily  Eat almonds in small quantities at least 3 days per week    If they eat meat, I recommend small portions of lean meats such as chicken, fish, and [place].  Eat as much NON corn and NON potato vegetables as they like, particularly raw or steamed  Drink several large glasses of water daily  Cautions:  Limit red meat  Limit corn and potatoes  Limit sweets, cake, pie, candy  Limit beer and alcohol  Avoid fried food, fast food, large portions  Avoid sugary drinks such as regular soda and sweet tea

## 2018-06-17 NOTE — Progress Notes (Signed)
Subjective: Chief Complaint  Patient presents with  . follow up    4 week follow up   Here for recheck.  At his last visit we started amlodipine blood pressure pill.  He sometimes feels a little lightheaded since starting this but is not bad.  Overall doing okay on the medication.  He has started making some changes in diet, less salt intake, but needs to exercise more.  No new complaints.  Last visit he found out that insurance will cover shingles shot but he is not ready to do this today. No other aggravating or relieving factors. No other complaint.   Past Medical History:  Diagnosis Date  . Abnormal MRA, brain 02/23/13   41mm small superior hypophoseal infundibulum (favored) vs aneurysm  . Allergic rhinitis   . Allergy   . Dyslipidemia   . H/O diagnostic ultrasound 8/14   carotid, normal  . H/O echocardiogram 7/14   TTE, normal LV function, concentric LV moderate hypertrophy, 65-70% EF  . Heart murmur   . History of MRI of brain and brain stem 02/23/13   Normal noncontrast MRI appearance of the brain. Incidental small right middle cranial fossa arachnoid cyst confirmed  . Hyperlipidemia   . 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 Stroke [facility])    TIA 02-2013  . TIA (transient ischemic attack) 7/14   Current Outpatient Medications on File Prior to Visit  Medication Sig Dispense Refill  . amLODipine (NORVASC) 5 MG tablet Take 1 tablet (5 mg total) by mouth daily. 90 tablet 3  . aspirin 81 MG EC tablet Take 1 tablet (81 mg total) by mouth daily. Swallow whole. 90 tablet 3  . pravastatin (PRAVACHOL) 20 MG tablet Take 1 tablet (20 mg total) by mouth daily. 90 tablet 3  . triamcinolone cream (KENALOG) 0.1 % Apply 1 application topically 2 (two) times daily. (Patient not taking: Reported on 06/17/2018) 30 g 0   No current facility-administered medications on file prior to visit.    ROS as in  subjective   Objective: BP 130/86   Pulse 86   Temp 98.1 F (36.7 C) (Oral)   Resp 16   Ht 5\' 7"  (1.702 m)   Wt 184 lb 6.4 oz (83.6 kg)   SpO2 97%   BMI 28.88 kg/m   Gen: wd, wn, nad Heart rrr, normal s1, s2, no murmurs Lungs  Clear Ext: no edema    Assessment: Encounter Diagnoses  Name Primary?  . Essential hypertension, benign Yes  . Vaccine counseling      Plan: We discussed medication, continue amlodipine 5 mg daily, he will work on lifestyle changes instead of increasing the dose of amlodipine.  Continue BP monitoring.  Discussed recommendations below  Patient Instructions  Recommendations  Continue amlodipine 5 mg daily for blood pressure  Check your blood pressure a few times per week  Goal is 120/70  Increase your exercise, aim for at least 150 minutes of exercise per week such as walking  Limit salt, look at food choices that are lower sodium and overall healthier in general  Come on in for that shingles shot soon.   Call for appointment to make sure we have it in stock  Call or My Chart in 4-6 weeks to give me update on weight and BP readings   I recommend exercising most days of the week using a type of exercise that they  would enjoy and stick to such as walking, running, swimming, hiking, biking, aerobics, etc.  I recommend a healthy diet.    Do's:   whole grains such as whole grain pasta, rice, whole grains breads and whole grain cereals.  Use small quantities such as 1/2 cup per serving or 2 slices of bread per serving.    Eat 3-5 fruits daily  Eat beans at least once daily  Eat almonds in small quantities at least 3 days per week    If they eat meat, I recommend small portions of lean meats such as chicken, fish, and [place].  Eat as much NON corn and NON potato vegetables as they like, particularly raw or steamed  Drink several large glasses of water daily  Cautions:  Limit red meat  Limit corn and potatoes  Limit sweets,  cake, pie, candy  Limit beer and alcohol  Avoid fried food, fast food, large portions  Avoid sugary drinks such as regular soda and sweet tea

## 2019-05-24 ENCOUNTER — Encounter: Payer: Self-pay | Admitting: Medical

## 2019-05-24 ENCOUNTER — Ambulatory Visit (INDEPENDENT_AMBULATORY_CARE_PROVIDER_SITE_OTHER): Payer: BC Managed Care – PPO | Admitting: Medical

## 2019-05-24 ENCOUNTER — Other Ambulatory Visit: Payer: Self-pay

## 2019-05-24 VITALS — BP 134/88 | HR 73 | Temp 98.0°F | Ht 67.0 in | Wt 179.0 lb

## 2019-05-24 DIAGNOSIS — I10 Essential (primary) hypertension: Secondary | ICD-10-CM

## 2019-05-24 DIAGNOSIS — Z8673 Personal history of transient ischemic attack (TIA), and cerebral infarction without residual deficits: Secondary | ICD-10-CM

## 2019-05-24 DIAGNOSIS — Z8579 Personal history of other malignant neoplasms of lymphoid, hematopoietic and related tissues: Secondary | ICD-10-CM | POA: Insufficient documentation

## 2019-05-24 DIAGNOSIS — Z0000 Encounter for general adult medical examination without abnormal findings: Secondary | ICD-10-CM | POA: Diagnosis not present

## 2019-05-24 DIAGNOSIS — Z7185 Encounter for immunization safety counseling: Secondary | ICD-10-CM

## 2019-05-24 DIAGNOSIS — J309 Allergic rhinitis, unspecified: Secondary | ICD-10-CM

## 2019-05-24 DIAGNOSIS — Z23 Encounter for immunization: Secondary | ICD-10-CM | POA: Diagnosis not present

## 2019-05-24 DIAGNOSIS — R7301 Impaired fasting glucose: Secondary | ICD-10-CM

## 2019-05-24 DIAGNOSIS — C8415 Sezary disease, lymph nodes of inguinal region and lower limb: Secondary | ICD-10-CM

## 2019-05-24 DIAGNOSIS — C8405 Mycosis fungoides, lymph nodes of inguinal region and lower limb: Secondary | ICD-10-CM

## 2019-05-24 DIAGNOSIS — E785 Hyperlipidemia, unspecified: Secondary | ICD-10-CM

## 2019-05-24 DIAGNOSIS — Z1211 Encounter for screening for malignant neoplasm of colon: Secondary | ICD-10-CM

## 2019-05-24 DIAGNOSIS — N400 Enlarged prostate without lower urinary tract symptoms: Secondary | ICD-10-CM

## 2019-05-24 DIAGNOSIS — Z8249 Family history of ischemic heart disease and other diseases of the circulatory system: Secondary | ICD-10-CM

## 2019-05-24 DIAGNOSIS — Z7189 Other specified counseling: Secondary | ICD-10-CM

## 2019-05-24 DIAGNOSIS — Z8572 Personal history of non-Hodgkin lymphomas: Secondary | ICD-10-CM

## 2019-05-24 LAB — CBC WITH DIFFERENTIAL/PLATELET
Basophils Absolute: 0 10*3/uL (ref 0.0–0.2)
Basos: 1 %
EOS (ABSOLUTE): 0.2 10*3/uL (ref 0.0–0.4)
Eos: 3 %
Hematocrit: 48 % (ref 37.5–51.0)
Hemoglobin: 16.8 g/dL (ref 13.0–17.7)
Immature Grans (Abs): 0 10*3/uL (ref 0.0–0.1)
Immature Granulocytes: 0 %
Lymphocytes Absolute: 1.5 10*3/uL (ref 0.7–3.1)
Lymphs: 28 %
MCH: 31.6 pg (ref 26.6–33.0)
MCHC: 35 g/dL (ref 31.5–35.7)
MCV: 90 fL (ref 79–97)
Monocytes Absolute: 0.5 10*3/uL (ref 0.1–0.9)
Monocytes: 10 %
Neutrophils Absolute: 3.1 10*3/uL (ref 1.4–7.0)
Neutrophils: 58 %
Platelets: 197 10*3/uL (ref 150–450)
RBC: 5.31 x10E6/uL (ref 4.14–5.80)
RDW: 13.2 % (ref 11.6–15.4)
WBC: 5.3 10*3/uL (ref 3.4–10.8)

## 2019-05-24 LAB — COMPREHENSIVE METABOLIC PANEL
ALT: 17 IU/L (ref 0–44)
AST: 20 IU/L (ref 0–40)
Albumin/Globulin Ratio: 1.9 (ref 1.2–2.2)
Albumin: 4.7 g/dL (ref 3.8–4.9)
Alkaline Phosphatase: 72 IU/L (ref 39–117)
BUN/Creatinine Ratio: 11 (ref 9–20)
BUN: 13 mg/dL (ref 6–24)
Bilirubin Total: 0.4 mg/dL (ref 0.0–1.2)
CO2: 22 mmol/L (ref 20–29)
Calcium: 9.4 mg/dL (ref 8.7–10.2)
Chloride: 105 mmol/L (ref 96–106)
Creatinine, Ser: 1.14 mg/dL (ref 0.76–1.27)
GFR calc Af Amer: 82 mL/min/{1.73_m2} (ref >59)
GFR calc non Af Amer: 71 mL/min/{1.73_m2} (ref >59)
Globulin, Total: 2.5 g/dL (ref 1.5–4.5)
Glucose: 97 mg/dL (ref 65–99)
Potassium: 4.2 mmol/L (ref 3.5–5.2)
Sodium: 141 mmol/L (ref 134–144)
Total Protein: 7.2 g/dL (ref 6.0–8.5)

## 2019-05-24 LAB — HEMOGLOBIN A1C
Est. average glucose Bld gHb Est-mCnc: 123 mg/dL
Hgb A1c MFr Bld: 5.9 % — ABNORMAL HIGH (ref 4.8–5.6)

## 2019-05-24 LAB — LIPID PANEL
Chol/HDL Ratio: 3.5 ratio (ref 0.0–5.0)
Cholesterol, Total: 186 mg/dL (ref 100–199)
HDL: 53 mg/dL (ref >39)
LDL Chol Calc (NIH): 120 mg/dL — ABNORMAL HIGH (ref 0–99)
Triglycerides: 70 mg/dL (ref 0–149)
VLDL Cholesterol Cal: 13 mg/dL (ref 5–40)

## 2019-05-24 NOTE — Addendum Note (Signed)
Addended by: Edgar Frisk on: 05/24/2019 04:50 PM   Modules accepted: Orders

## 2019-05-24 NOTE — Progress Notes (Addendum)
Subjective:   HPI  Hayden Huang is a 58 y.o. male who presents for Chief Complaint  Patient presents with  . Annual Exam    fasting     Patient Care Team: Jozlin Bently, Leward Quan as PCP - General (Family Medicine) Sees dentist Sees eye doctor Dr. Scarlette Shorts, GI Dr. Adelene Idler, dermatology, [facility]  Concerns: None, feeling fine.  Working 60+ hours per week.  Reviewed their medical, surgical, family, social, medication, and allergy history and updated chart as appropriate.  Past Medical History:  Diagnosis Date  . Abnormal MRA, brain 02/23/13   28m small superior hypophoseal infundibulum (favored) vs aneurysm  . Allergic rhinitis   . Allergy   . Dyslipidemia   . H/O diagnostic ultrasound 8/14   carotid, normal  . H/O echocardiogram 7/14   TTE, normal LV function, concentric LV moderate hypertrophy, 65-70% EF  . Heart murmur   . History of MRI of brain and brain stem 02/23/13   Normal noncontrast MRI appearance of the brain. Incidental small right middle cranial fossa arachnoid cyst confirmed  . Hyperlipidemia   . [place]    cutaneous T-cell lymphoma; in remission, sees oncology yearly  . [place]   . Stroke ([facility]    TIA 02-2013  . TIA (transient ischemic attack) 7/14    Past Surgical History:  Procedure Laterality Date  . COLONOSCOPY  03/23/15   normal, repeat 2026; Dr. JScarlette Shorts . WISDOM TOOTH EXTRACTION     with sedation    Social History   Socioeconomic History  . Marital status: Single    Spouse name: Not on file  . Number of children: Not on file  . Years of education: Not on file  . Highest education level: Not on file  Occupational History  . Not on file  Social Needs  . Financial resource strain: Not on file  . Food insecurity    Worry: Not on file    Inability: Not on file  . Transportation needs    Medical: Not on file    Non-medical: Not on file  Tobacco Use  . Smoking status: Never Smoker  .  Smokeless tobacco: Never Used  Substance and Sexual Activity  . Alcohol use: Yes    Alcohol/week: 5.0 standard drinks    Types: 5 Cans of beer per week    Comment: occ  . Drug use: No  . Sexual activity: Not on file  Lifestyle  . Physical activity    Days per week: Not on file    Minutes per session: Not on file  . Stress: Not on file  Relationships  . Social [place] phone: Not on file    Gets together: Not on file    Attends religious service: Not on file    Active member of club or organization: Not on file    Attends meetings of clubs or organizations: Not on file    Relationship status: Not on file  . Intimate partner violence    Fear of current or ex partner: Not on file    Emotionally abused: Not on file    Physically abused: Not on file    Forced sexual activity: Not on file  Other Topics Concern  . Not on file  Social History Narrative   Lives in a house with his wife, 3 children grown, no grandchildren.  No stairs.  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  05/2019    Family History  Problem Relation Age of Onset  . Diabetes Father   . Hypertension Father   . Stroke Father   . AAA (abdominal aortic aneurysm) Father   . Diabetes Brother   . Hyperlipidemia Mother   . Hypertension Mother   . Other Brother        brain injury s/p MVA  . High blood pressure Other   . Prostate cancer Paternal Grandfather   . Cancer Maternal Grandmother        liver  . Colon cancer Neg Hx   . Rectal cancer Neg Hx   . Stomach cancer Neg Hx   . Esophageal cancer Neg Hx   . Heart disease Neg Hx      Current Outpatient Medications:  .  amLODipine (NORVASC) 5 MG tablet, Take 1 tablet (5 mg total) by mouth daily., Disp: 90 tablet, Rfl: 3 .  aspirin 81 MG EC tablet, Take 1 tablet (81 mg total) by mouth daily. Swallow whole., Disp: 90 tablet, Rfl: 3 .  pravastatin (PRAVACHOL) 20 MG tablet, Take 1 tablet (20 mg total) by mouth  daily., Disp: 90 tablet, Rfl: 3 .  triamcinolone cream (KENALOG) 0.1 %, Apply 1 application topically 2 (two) times daily. (Patient not taking: Reported on 05/24/2019), Disp: 30 g, Rfl: 0  No Known Allergies     Review of Systems Constitutional: -fever, -chills, -sweats, -unexpected weight change, -decreased appetite, -fatigue Allergy: -sneezing, -itching, -congestion Dermatology: -changing moles, --rash, -lumps ENT: -runny nose, -ear pain, -sore throat, -hoarseness, -sinus pain, -teeth pain, - ringing in ears, -hearing loss, -nosebleeds Cardiology: -chest pain, -palpitations, -swelling, -difficulty breathing when lying flat, -waking up short of breath Respiratory: -cough, -shortness of breath, -difficulty breathing with exercise or exertion, -wheezing, -coughing up blood Gastroenterology: -abdominal pain, -nausea, -vomiting, -diarrhea, -constipation, -blood in stool, -changes in bowel movement, -difficulty swallowing or eating Hematology: -bleeding, -bruising  Musculoskeletal: -joint aches, -muscle aches, -joint swelling, -back pain, -neck pain, -cramping, -changes in gait Ophthalmology: denies vision changes, eye redness, itching, discharge Urology: -burning with urination, -difficulty urinating, -blood in urine, -urinary frequency, -urgency, -incontinence Neurology: -headache, -weakness, -tingling, -numbness, -memory loss, -falls, -dizziness Psychology: -depressed mood, -agitation, -sleep problems Male GU: no testicular mass, pain, no lymph nodes swollen, no swelling, no rash.     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 HEENT: normocephalic, conjunctiva/corneas normal, sclerae anicteric, PERRLA, EOMi, nares patent, no discharge or  erythema, pharynx normal Oral cavity: MMM, tongue normal, teeth normal Neck: supple, no lymphadenopathy, no thyromegaly, no masses, normal ROM, no bruits Chest: non tender, normal shape and expansion Heart: RRR, normal S1, S2, no murmurs Lungs: CTA bilaterally, no wheezes, rhonchi, or rales Abdomen: +bs, soft, non tender, non distended, no masses, no hepatomegaly, no splenomegaly, no bruits Back: non tender, normal ROM, no scoliosis Musculoskeletal: upper extremities non tender, no obvious deformity, normal ROM throughout, lower extremities non tender, no obvious deformity, normal ROM throughout Extremities: no edema, no cyanosis, no clubbing Pulses: 2+ symmetric, upper and lower extremities, normal cap refill Neurological: alert, oriented x 3, CN2-12 intact, strength normal upper extremities and lower extremities, sensation normal throughout,  DTRs 2+ throughout, no cerebellar signs, gait normal Psychiatric: normal affect, behavior normal, pleasant  GU: normal male external genitalia, circumcised, nontender, spermatocele palpated posterior of right testicle, mobile, nontender, no other masses, no hernia, no lymphadenopathy Rectal: deferred   Assessment and Plan :   Encounter Diagnoses  Name Primary?  . Encounter for health maintenance examination in adult Yes  . Need for pneumococcal vaccination   . Vaccine counseling   . Impaired fasting blood sugar   . Essential hypertension, benign   . Allergic rhinitis, unspecified seasonality, unspecified trigger   . History of TIA (transient ischemic attack)   . Family history of abdominal aortic aneurysm (AAA)   . Dyslipidemia   . BPH without urinary obstruction   . 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 History of lymphoma   . Screen for colon cancer   . 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    Health screening: See your eye doctor yearly for routine vision care. See your dentist yearly for routine dental care including hygiene visits twice yearly.  Cancer screening Colonoscopy:  Reviewed colonoscopy on file that is up to date Given stool cards kit to return for hemoccult screening  Discussed PSA, prostate exam, and prostate cancer screening risks/benefits.   Done 2019, normal  Vaccinations: Advised yearly influenza vaccine Up to date on flu shot  Up to date on Td vaccine  Counseled on the pneumococcal vaccine.  Vaccine information sheet given.  Pneumococcal vaccine PPSV23 given after consent obtained.   Shingles vaccine:  I recommend you have a shingles vaccine to help prevent shingles or herpes zoster outbreak.   Please call your insurer to inquire about coverage for the Shingrix vaccine given in 2 doses.   Some insurers cover this vaccine after age 59, some cover this after age 63.  If your insurer covers this, then call to schedule appointment to have this vaccine here.    Acute issues discussed: none  Separate significant chronic issues discussed: HTN - c/t amlodipine, consider adding ACEi.  Reviewed 2014 echocardiogram  Hyperlipidemia - c/t statin.  IA Stage mycosis fungoides - reviewed 01/2019 note from Dr. Irish Elders, he continues on topical Valchlor fir right thigh and left buttock region.   C/t routine f/u with dermatology   Lawyer was seen today for annual exam.  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

## 2019-05-25 ENCOUNTER — Other Ambulatory Visit: Payer: Self-pay | Admitting: Medical

## 2019-05-25 MED ORDER — AMLODIPINE-OLMESARTAN 5-20 MG PO TABS: 1.0000 | ORAL_TABLET | Freq: Every day | ORAL | 3 refills | Status: DC

## 2019-05-25 MED ORDER — ROSUVASTATIN CALCIUM 20 MG PO TABS: 20.0000 mg | ORAL_TABLET | Freq: Every day | ORAL | 3 refills | Status: DC

## 2019-05-25 MED ORDER — ASPIRIN 81 MG PO TBEC: 81.0000 mg | DELAYED_RELEASE_TABLET | Freq: Every day | ORAL | 3 refills | Status: DC

## 2019-06-04 ENCOUNTER — Other Ambulatory Visit: Payer: Self-pay | Admitting: Medical

## 2019-10-28 ENCOUNTER — Ambulatory Visit (INDEPENDENT_AMBULATORY_CARE_PROVIDER_SITE_OTHER): Payer: BC Managed Care – PPO | Admitting: Family Medicine

## 2019-10-28 ENCOUNTER — Other Ambulatory Visit: Payer: Self-pay

## 2019-10-28 ENCOUNTER — Encounter: Payer: Self-pay | Admitting: Family Medicine

## 2019-10-28 VITALS — Wt 185.0 lb

## 2019-10-28 DIAGNOSIS — R0981 Nasal congestion: Secondary | ICD-10-CM | POA: Diagnosis not present

## 2019-10-28 DIAGNOSIS — J309 Allergic rhinitis, unspecified: Secondary | ICD-10-CM | POA: Diagnosis not present

## 2019-10-28 NOTE — Progress Notes (Signed)
   Subjective:  Documentation for virtual audio and video telecommunications through [place] encounter:  The patient was located at home. 2 patient identifiers used.  The provider was located in the office. The patient did consent to this visit and is aware of possible charges through their insurance for this visit.  The other persons participating in this telemedicine service were none.    Patient ID: Hayden Huang, male    DOB: 10/25/1960, 59 y.o.   MRN: [id number]  HPI Chief Complaint  Patient presents with  . possibe sinus infection    possible sinus infection- over a week ago, stopped up,    Complains of a one week history of sneezing, watery eyes, ears feeling full and severe nasal congestion.  Symptoms worse over the past 2 days.  He started taking Claritin-D 4 days ago. He is having some purulent nasal drainage. He denies fever, chills, headache, sinus pain or pressure, sore throat, cough, chest pain, shortness of breath.  History of allergic rhinitis.   Review of Systems Pertinent positives and negatives in the history of present illness.     Objective:   Physical Exam Wt 185 lb (83.9 kg)   BMI 28.98 kg/m   Alert and oriented and in no acute distress.  Respirations unlabored.  Denies frontal headache and sinuses nontender when he palpates them.      Assessment & Plan:  Nasal congestion  Allergic rhinitis, unspecified seasonality, unspecified trigger  Suspect symptoms currently related to allergies and recommend continuing on Claritin-D but adding a steroid nasal spray such as Flonase, Rhinocort, Nasacort.  Increase water intake and may take Tylenol or ibuprofen if needed.  Discussed that this may be an early sinusitis. He will call back if symptoms are worsening specifically if he develops fever, headache, sinus pain or pressure and I will send in an antibiotic for him at that time.  Time spent on call was 10 minutes and in review of previous records 15  minutes total.  This virtual service is not related to other E/M service within previous 7 days.

## 2020-05-24 ENCOUNTER — Ambulatory Visit (INDEPENDENT_AMBULATORY_CARE_PROVIDER_SITE_OTHER): Payer: BC Managed Care – PPO | Admitting: Medical

## 2020-05-24 ENCOUNTER — Other Ambulatory Visit: Payer: Self-pay

## 2020-05-24 ENCOUNTER — Encounter: Payer: Self-pay | Admitting: Medical

## 2020-05-24 VITALS — BP 150/102 | HR 89 | Ht 67.0 in | Wt 181.8 lb

## 2020-05-24 DIAGNOSIS — Z7185 Encounter for immunization safety counseling: Secondary | ICD-10-CM

## 2020-05-24 DIAGNOSIS — E785 Hyperlipidemia, unspecified: Secondary | ICD-10-CM

## 2020-05-24 DIAGNOSIS — I10 Essential (primary) hypertension: Secondary | ICD-10-CM | POA: Diagnosis not present

## 2020-05-24 DIAGNOSIS — Z1211 Encounter for screening for malignant neoplasm of colon: Secondary | ICD-10-CM

## 2020-05-24 DIAGNOSIS — Z23 Encounter for immunization: Secondary | ICD-10-CM

## 2020-05-24 DIAGNOSIS — Z125 Encounter for screening for malignant neoplasm of prostate: Secondary | ICD-10-CM | POA: Diagnosis not present

## 2020-05-24 DIAGNOSIS — Z8673 Personal history of transient ischemic attack (TIA), and cerebral infarction without residual deficits: Secondary | ICD-10-CM

## 2020-05-24 DIAGNOSIS — Z0000 Encounter for general adult medical examination without abnormal findings: Secondary | ICD-10-CM | POA: Diagnosis not present

## 2020-05-24 DIAGNOSIS — N400 Enlarged prostate without lower urinary tract symptoms: Secondary | ICD-10-CM

## 2020-05-24 DIAGNOSIS — R7301 Impaired fasting glucose: Secondary | ICD-10-CM

## 2020-05-24 DIAGNOSIS — C8415 Sezary disease, lymph nodes of inguinal region and lower limb: Secondary | ICD-10-CM

## 2020-05-24 DIAGNOSIS — C8405 Mycosis fungoides, lymph nodes of inguinal region and lower limb: Secondary | ICD-10-CM

## 2020-05-24 DIAGNOSIS — Z8579 Personal history of other malignant neoplasms of lymphoid, hematopoietic and related tissues: Secondary | ICD-10-CM

## 2020-05-24 DIAGNOSIS — L509 Urticaria, unspecified: Secondary | ICD-10-CM | POA: Insufficient documentation

## 2020-05-24 DIAGNOSIS — Z8249 Family history of ischemic heart disease and other diseases of the circulatory system: Secondary | ICD-10-CM

## 2020-05-24 DIAGNOSIS — J309 Allergic rhinitis, unspecified: Secondary | ICD-10-CM

## 2020-05-24 MED ORDER — ASPIRIN 81 MG PO TBEC: 81.0000 mg | DELAYED_RELEASE_TABLET | Freq: Every day | ORAL | 3 refills | Status: DC

## 2020-05-24 MED ORDER — AMLODIPINE-OLMESARTAN 5-20 MG PO TABS: 1.0000 | ORAL_TABLET | Freq: Every day | ORAL | 3 refills | Status: DC

## 2020-05-24 MED ORDER — ROSUVASTATIN CALCIUM 20 MG PO TABS: 20.0000 mg | ORAL_TABLET | Freq: Every day | ORAL | 3 refills | Status: DC

## 2020-05-24 NOTE — Addendum Note (Signed)
Addended by: Edgar Frisk on: 05/24/2020 10:18 AM   Modules accepted: Orders

## 2020-05-24 NOTE — Progress Notes (Signed)
Complete physical exam   Patient: Hayden MCKINNON Sr.   DOB: August 02, 1961   59 y.o. Male  MRN: [id number] Visit Date: 05/24/2020  Today's healthcare provider: Dorothea Ogle, PA-C  HealthcCare Team: Neurology: Dr. Ernst Breach: Dr. Scarlette Shorts Dermatology: Dr. Adelene Idler   Chief Complaint  Patient presents with  . Annual Exam    cpe with fasting labs-had a little coffee   I,Hayden Huang,acting as a scribe for [employer], PA-C.,have documented all relevant documentation on the behalf of Dorothea Ogle, PA-C,as directed by  [employer], PA-C while in the presence of [employer], PA-C.  Subjective    Hayden ELENES Sr. is a 59 y.o. male who presents today for a complete physical exam.   Work 11 hours a day at current job. No vacation soon and have 5 weeks of vacation at work to take.  HPI HPI    Annual Exam     Additional comments: cpe with fasting labs-had a little coffee        Last edited by Edgar Frisk, [place] on 05/24/2020  9:17 AM. (History)      Hypertension Patient reports not currently taking blood pressure medication. Also he doesn't check blood pressure at home.  Hyperlipidemia Compliant with medication without complaint  Patient reports breaking out in hives recently on his arms.  No specific trigger   Past Medical History:  Diagnosis Date  . Abnormal MRA, brain 02/23/13   75m small superior hypophoseal infundibulum (favored) vs aneurysm  . Allergic rhinitis   . Allergy   . Dyslipidemia   . H/O diagnostic ultrasound 8/14   carotid, normal  . H/O echocardiogram 7/14   TTE, normal LV function, concentric LV moderate hypertrophy, 65-70% EF  . Heart murmur   . History of MRI of brain and brain stem 02/23/13   Normal noncontrast MRI appearance of the brain. Incidental small right middle cranial fossa arachnoid cyst confirmed  . Hyperlipidemia   . Mycosis fungoides lymphoma ([place]    cutaneous T-cell lymphoma; in remission, sees  oncology yearly  . Sezary syndrome ([place]   . St[facility] ([facility]    TIA 02-2013  . TIA (transient ischemic attack) 7/14   Past Surgical History:  Procedure Laterality Date  . COLONOSCOPY  03/23/15   normal, repeat 2026; Dr. JScarlette Shorts . WISDOM TOOTH EXTRACTION     with sedation    Family Status  Relation Name Status  . Father  Deceased  . Brother  Alive  . Mother  Alive  . Brother  Alive  . Sister  Alive  . Other  (Not Specified)  . PGF  (Not Specified)  . MGM  (Not Specified)  . Neg Hx  (Not Specified)   Family History  Problem Relation Age of Onset  . Diabetes Father   . Hypertension Father   . Stroke Father   . AAA (abdominal aortic aneurysm) Father   . Diabetes Brother   . Hyperlipidemia Mother   . Hypertension Mother   . Other Brother        brain injury s/p MVA  . High blood pressure Other   . Prostate cancer Paternal Grandfather   . Cancer Maternal Grandmother        liver  . Colon cancer Neg Hx   . Rectal cancer Neg Hx   . Stomach cancer Neg Hx   . Esophageal cancer Neg Hx   . Heart disease Neg Hx    No Known  Allergies  Patient Care Team: Sundy Houchins, Leward Quan as PCP - General (Family Medicine)   Medications: Outpatient Medications Prior to Visit  Medication Sig  . [DISCONTINUED] amLODipine-olmesartan (AZOR) 5-20 MG tablet Take 1 tablet by mouth daily.  . [DISCONTINUED] aspirin 81 MG EC tablet Take 1 tablet (81 mg total) by mouth daily. Swallow whole.  . [DISCONTINUED] rosuvastatin (CRESTOR) 20 MG tablet Take 1 tablet (20 mg total) by mouth at bedtime.   No facility-administered medications prior to visit.    Review of Systems Review of Systems Constitutional: -fever, -chills, -sweats, -unexpected weight change, -anorexia, -fatigue Allergy: -sneezing, -itching, -congestion Dermatology: denies changing moles, +rash, lumps, new worrisome lesions ENT: -runny nose, -ear pain, -sore throat, -hoarseness, -sinus pain, -teeth pain, -tinnitus, -hearing loss,  -epistaxis Cardiology:  -chest pain, -palpitations, -edema, -orthopnea, -paroxysmal nocturnal dyspnea Respiratory: -cough, -shortness of breath, -dyspnea on exertion, -wheezing, -hemoptysis Gastroenterology: -abdominal pain, -nausea, -vomiting, -diarrhea, -constipation, -blood in stool, -changes in bowel movement, -dysphagia Hematology: -bleeding or bruising problems Musculoskeletal: -arthralgias, -myalgias, -joint swelling, -back pain, -neck pain, -cramping, -gait changes Ophthalmology: -vision changes, -eye redness, -itching, -discharge Urology: -dysuria, -difficulty urinating, -hematuria, -urinary frequency, -urgency, incontinence Neurology: -headache, -weakness, -tingling, -numbness, -speech abnormality, -memory loss, -falls, -dizziness Psychology:  -depressed mood, -agitation, -sleep problems    Objective    BP (!) 150/102   Pulse 89   Ht _0  (1.702 m)   Wt 181 lb 12.8 oz (82.5 kg)   SpO2 96%   BMI 28.47 kg/m    Physical Exam Constitutional:      Appearance: Normal appearance.  Neck:     Vascular: No carotid bruit.  Cardiovascular:     Rate and Rhythm: Normal rate and regular rhythm.     Pulses: Normal pulses.     Heart sounds: Normal heart sounds. No murmur heard.  No gallop.   Pulmonary:     Effort: Pulmonary effort is normal.     Breath sounds: Normal breath sounds. No wheezing, rhonchi or rales.  Abdominal:     General: Abdomen is flat. Bowel sounds are normal.     Palpations: Abdomen is soft.     Tenderness: There is no abdominal tenderness.     Hernia: No hernia is present.  Genitourinary:    Comments: GU:, Normal male, circumflex, no mass, no lymphadenopathy, no hernia, DRE declined Musculoskeletal:     Cervical back: Normal range of motion and neck supple. No rigidity.  Skin:    General: Skin is warm and dry.  Neurological:     General: No focal deficit present.     Mental Status: He is alert and oriented to person, place, and time.  Psychiatric:         Mood and Affect: Mood normal.        Behavior: Behavior normal.      BP Readings from Last 3 Encounters:  05/24/20 (!) 150/102  05/24/19 134/88  06/17/18 130/86    Last depression screening scores PHQ 2/9 Scores 05/24/2020 05/24/2019 05/20/2018  PHQ - 2 Score 0 0 0   Last fall risk screening Fall Risk  05/24/2019  Falls in the past year? 0      Assessment & Plan   Encounter Diagnoses  Name Primary?  . Encounter for health maintenance examination in adult Yes  . Essential hypertension, benign   . Dyslipidemia   . Screening for prostate cancer   . Vaccine counseling   . Impaired fasting blood sugar   . Allergic rhinitis, unspecified  seasonality, unspecified trigger   . 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 BPH without urinary obstruction   . History of TIA (transient ischemic attack)   . Screen for colon cancer   . Family history of abdominal aortic aneurysm (AAA)   . History of lymphoma   . Hives       Routine Health Maintenance and Physical Exam  Exercise Activities and Dietary recommendations Goals   None     Immunization History  Administered Date(s) Administered  . Influenza Inj Mdck Quad Pf 05/11/2019  . Influenza,inj,Quad PF,6+ Mos 05/24/2020  . Influenza-Unspecified 05/11/2019  . PFIZER SARS-COV-2 Vaccination 03/21/2020, 04/11/2020  . Pneumococcal Polysaccharide-23 05/24/2019  . Tdap 09/22/2015    Health Maintenance  Topic Date Due  . Hepatitis C Screening  Never done  . HIV Screening  Never done  . COLONOSCOPY  03/22/2025  . TETANUS/TDAP  09/21/2025  . INFLUENZA VACCINE  Completed  . COVID-19 Vaccine  Completed     Physical exam - discussed and counseled on healthy lifestyle, diet, exercise, preventative care, vaccinations, sick and well care, proper use of emergency dept and after hours care, and addressed their concerns.    Health  screening: See your eye doctor yearly for routine vision care. See your dentist yearly for routine dental care including hygiene visits twice yearly.  Cancer screening Colonoscopy:  Reviewed colonoscopy on file that is up to date Given stool cards kit to return for hemoccult screening   Discussed PSA, prostate exam, and prostate cancer screening risks/benefits.       Vaccinations: Advise he check insurance for shingles vaccine coverage and come back for this  He is up-to-date on Covid and tetanus vaccines.  Counseled on the influenza virus vaccine.  Vaccine information sheet given.  Influenza vaccine given after consent obtained.     Acute issues discussed: Hives-recently not current.  Labs today.  Not sure of etiology  Separate significant chronic issues discussed: Hypertension-noncompliant.  For some reason he did not call back for refill and just stopped taking his medication.  We discussed importance of compliance.  Restart medication.  Routine labs today.  Hyperlipidemia-continue current medication, routine labs today  BPH-no complaint  Impaired glucose-routine labs today  History lymphoma-advised routine follow-up with his dermatologist.  I reviewed recent notes in the chart record regarding this follow-up  History of TIA-consider carotid ultrasound, consider coronary calcium score    Mohsin was seen today for annual exam.  Diagnoses and all orders for this visit:  Encounter for health maintenance examination in adult -     PSA -     Lipid panel -     Comprehensive metabolic panel -     CBC with Differential/Platelet -     Hemoglobin A1c -     Lactate Dehydrogenase -     US Carotid Duplex Bilateral; Future -     CT CARDIAC SCORING; Future  Essential hypertension, benign -     Comprehensive metabolic panel -     US Carotid Duplex Bilateral; Future -     CT CARDIAC SCORING; Future  Dyslipidemia -     Lipid panel -     Comprehensive metabolic panel -      US Carotid Duplex Bilateral; Future -     CT CARDIAC SCORING; Future  Screening for prostate cancer -     PSA  Vaccine counseling  Impaired fasting  blood sugar -     Hemoglobin A1c  Allergic rhinitis, unspecified seasonality, unspecified trigger  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 -     aspirin 81 MG EC tablet; Take 1 tablet (81 mg total) by mouth daily. Swallow whole. -     amLODipine-olmesartan (AZOR) 5-20 MG tablet; Take 1 tablet by mouth daily.    Follow-up pending labs, yearly for physical       Michela Pitcher  [place] [phone] (phone) [phone] (fax)) fair really do not but he is only coming once a year  [place]

## 2020-05-25 ENCOUNTER — Other Ambulatory Visit: Payer: Self-pay | Admitting: Medical

## 2020-05-25 LAB — COMPREHENSIVE METABOLIC PANEL
ALT: 18 IU/L (ref 0–44)
AST: 20 IU/L (ref 0–40)
Albumin/Globulin Ratio: 2.2 (ref 1.2–2.2)
Albumin: 4.8 g/dL (ref 3.8–4.9)
Alkaline Phosphatase: 60 IU/L (ref 44–121)
BUN/Creatinine Ratio: 10 (ref 9–20)
BUN: 11 mg/dL (ref 6–24)
Bilirubin Total: 0.4 mg/dL (ref 0.0–1.2)
CO2: 21 mmol/L (ref 20–29)
Calcium: 9.5 mg/dL (ref 8.7–10.2)
Chloride: 104 mmol/L (ref 96–106)
Creatinine, Ser: 1.09 mg/dL (ref 0.76–1.27)
GFR calc Af Amer: 86 mL/min/{1.73_m2} (ref >59)
GFR calc non Af Amer: 74 mL/min/{1.73_m2} (ref >59)
Globulin, Total: 2.2 g/dL (ref 1.5–4.5)
Glucose: 98 mg/dL (ref 65–99)
Potassium: 4.2 mmol/L (ref 3.5–5.2)
Sodium: 140 mmol/L (ref 134–144)
Total Protein: 7 g/dL (ref 6.0–8.5)

## 2020-05-25 LAB — LIPID PANEL
Chol/HDL Ratio: 2.8 ratio (ref 0.0–5.0)
Cholesterol, Total: 135 mg/dL (ref 100–199)
HDL: 48 mg/dL (ref >39)
LDL Chol Calc (NIH): 73 mg/dL (ref 0–99)
Triglycerides: 70 mg/dL (ref 0–149)
VLDL Cholesterol Cal: 14 mg/dL (ref 5–40)

## 2020-05-25 LAB — CBC WITH DIFFERENTIAL/PLATELET
Basophils Absolute: 0 10*3/uL (ref 0.0–0.2)
Basos: 1 %
EOS (ABSOLUTE): 0.1 10*3/uL (ref 0.0–0.4)
Eos: 3 %
Hematocrit: 49.6 % (ref 37.5–51.0)
Hemoglobin: 17.1 g/dL (ref 13.0–17.7)
Immature Grans (Abs): 0 10*3/uL (ref 0.0–0.1)
Immature Granulocytes: 0 %
Lymphocytes Absolute: 1.4 10*3/uL (ref 0.7–3.1)
Lymphs: 27 %
MCH: 31.9 pg (ref 26.6–33.0)
MCHC: 34.5 g/dL (ref 31.5–35.7)
MCV: 93 fL (ref 79–97)
Monocytes Absolute: 0.7 10*3/uL (ref 0.1–0.9)
Monocytes: 13 %
Neutrophils Absolute: 3 10*3/uL (ref 1.4–7.0)
Neutrophils: 56 %
Platelets: 171 10*3/uL (ref 150–450)
RBC: 5.36 x10E6/uL (ref 4.14–5.80)
RDW: 13.8 % (ref 11.6–15.4)
WBC: 5.2 10*3/uL (ref 3.4–10.8)

## 2020-05-25 LAB — HEMOGLOBIN A1C
Est. average glucose Bld gHb Est-mCnc: 126 mg/dL
Hgb A1c MFr Bld: 6 % — ABNORMAL HIGH (ref 4.8–5.6)

## 2020-05-25 LAB — LACTATE DEHYDROGENASE: LDH: 154 IU/L (ref 121–224)

## 2020-05-25 LAB — PSA: Prostate Specific Ag, Serum: 0.4 ng/mL (ref 0.0–4.0)

## 2020-06-05 ENCOUNTER — Ambulatory Visit
Admission: RE | Admit: 2020-06-05 | Discharge: 2020-06-05 | Disposition: A | Discharge status: Home / Self Care | Payer: BLUE CROSS/BLUE SHIELD | Source: Ambulatory Visit | Attending: Medical | Admitting: Medical

## 2020-06-05 DIAGNOSIS — Z0000 Encounter for general adult medical examination without abnormal findings: Secondary | ICD-10-CM

## 2020-06-05 DIAGNOSIS — E785 Hyperlipidemia, unspecified: Secondary | ICD-10-CM

## 2020-06-05 DIAGNOSIS — Z8673 Personal history of transient ischemic attack (TIA), and cerebral infarction without residual deficits: Secondary | ICD-10-CM

## 2020-06-05 DIAGNOSIS — I10 Essential (primary) hypertension: Secondary | ICD-10-CM

## 2020-06-13 ENCOUNTER — Other Ambulatory Visit: Payer: Self-pay | Admitting: Medical

## 2020-06-13 DIAGNOSIS — E041 Nontoxic single thyroid nodule: Secondary | ICD-10-CM

## 2020-06-14 ENCOUNTER — Other Ambulatory Visit: Payer: Self-pay | Admitting: Medical

## 2020-06-14 ENCOUNTER — Telehealth: Payer: Self-pay | Admitting: Family Medicine

## 2020-06-14 DIAGNOSIS — E041 Nontoxic single thyroid nodule: Secondary | ICD-10-CM

## 2020-06-14 NOTE — Telephone Encounter (Signed)
The whole reason we are ordering this is because he had a scan done that showed an enlarged nodule.  Why do we need to do a separate ultrasound.  It sounds like this is just trying to drum up extra charges

## 2020-06-14 NOTE — Telephone Encounter (Signed)
[place] said radiologist will not do biopsy unless [place] of thyroid was done. I explained the finding was through a imaging that was done.

## 2020-06-14 NOTE — Telephone Encounter (Signed)
I put in the order for the thyroid ultrasound.  Please call them back and see if they can schedule the biopsy at the same time as the ultrasound or simultaneously?  If not then do whatever they recommend.  I still think this is a little ridiculous

## 2020-06-14 NOTE — Telephone Encounter (Signed)
Patient has not has an ultrasound of thee thyroid done. [place] stated that radiologist is requiring [place] of thyroid before a biopsy can be done.

## 2020-06-14 NOTE — Telephone Encounter (Signed)
[place] with [facility] needs the CD & report from previous Thyroid ULTRA SOUND or procedure, done offsite.  They will not do anything further/biopsy with out this information.

## 2020-06-15 NOTE — Telephone Encounter (Signed)
ok 

## 2020-06-15 NOTE — Telephone Encounter (Signed)
Ultrasound and biopsy cannot be done at the same time per [place].

## 2020-06-27 ENCOUNTER — Ambulatory Visit
Admission: RE | Admit: 2020-06-27 | Discharge: 2020-06-27 | Disposition: A | Discharge status: Home / Self Care | Payer: BC Managed Care – PPO | Source: Ambulatory Visit | Attending: Medical | Admitting: Medical

## 2020-06-27 DIAGNOSIS — E041 Nontoxic single thyroid nodule: Secondary | ICD-10-CM

## 2020-07-06 ENCOUNTER — Ambulatory Visit
Admission: RE | Admit: 2020-07-06 | Discharge: 2020-07-06 | Disposition: A | Discharge status: Home / Self Care | Payer: BC Managed Care – PPO | Source: Ambulatory Visit | Attending: Medical | Admitting: Medical

## 2020-07-06 ENCOUNTER — Other Ambulatory Visit (HOSPITAL_COMMUNITY)
Admission: RE | Admit: 2020-07-06 | Discharge: 2020-07-06 | Disposition: A | Discharge status: Home / Self Care | Payer: BC Managed Care – PPO | Source: Ambulatory Visit | Attending: Medical | Admitting: Medical

## 2020-07-06 DIAGNOSIS — E041 Nontoxic single thyroid nodule: Secondary | ICD-10-CM

## 2020-07-07 LAB — CYTOLOGY - NON PAP

## 2021-02-13 ENCOUNTER — Encounter: Payer: Self-pay | Admitting: Medical

## 2021-05-25 ENCOUNTER — Other Ambulatory Visit: Payer: Self-pay

## 2021-05-25 ENCOUNTER — Ambulatory Visit (INDEPENDENT_AMBULATORY_CARE_PROVIDER_SITE_OTHER): Payer: BC Managed Care – PPO | Admitting: Medical

## 2021-05-25 VITALS — BP 122/80 | HR 85 | Ht 66.0 in | Wt 184.2 lb

## 2021-05-25 DIAGNOSIS — N400 Enlarged prostate without lower urinary tract symptoms: Secondary | ICD-10-CM | POA: Diagnosis not present

## 2021-05-25 DIAGNOSIS — Z0000 Encounter for general adult medical examination without abnormal findings: Secondary | ICD-10-CM

## 2021-05-25 DIAGNOSIS — J309 Allergic rhinitis, unspecified: Secondary | ICD-10-CM

## 2021-05-25 DIAGNOSIS — Z7185 Encounter for immunization safety counseling: Secondary | ICD-10-CM | POA: Diagnosis not present

## 2021-05-25 DIAGNOSIS — C8415 Sezary disease, lymph nodes of inguinal region and lower limb: Secondary | ICD-10-CM

## 2021-05-25 DIAGNOSIS — Z8673 Personal history of transient ischemic attack (TIA), and cerebral infarction without residual deficits: Secondary | ICD-10-CM

## 2021-05-25 DIAGNOSIS — Z1211 Encounter for screening for malignant neoplasm of colon: Secondary | ICD-10-CM

## 2021-05-25 DIAGNOSIS — Z136 Encounter for screening for cardiovascular disorders: Secondary | ICD-10-CM

## 2021-05-25 DIAGNOSIS — Z125 Encounter for screening for malignant neoplasm of prostate: Secondary | ICD-10-CM

## 2021-05-25 DIAGNOSIS — Z8572 Personal history of non-Hodgkin lymphomas: Secondary | ICD-10-CM

## 2021-05-25 DIAGNOSIS — Z8249 Family history of ischemic heart disease and other diseases of the circulatory system: Secondary | ICD-10-CM

## 2021-05-25 DIAGNOSIS — I10 Essential (primary) hypertension: Secondary | ICD-10-CM

## 2021-05-25 DIAGNOSIS — R7301 Impaired fasting glucose: Secondary | ICD-10-CM

## 2021-05-25 DIAGNOSIS — Z23 Encounter for immunization: Secondary | ICD-10-CM | POA: Diagnosis not present

## 2021-05-25 DIAGNOSIS — E785 Hyperlipidemia, unspecified: Secondary | ICD-10-CM | POA: Diagnosis not present

## 2021-05-25 DIAGNOSIS — Z8579 Personal history of other malignant neoplasms of lymphoid, hematopoietic and related tissues: Secondary | ICD-10-CM

## 2021-05-25 NOTE — Progress Notes (Signed)
Subjective:   HPI  Hayden MERA Sr. is a 60 y.o. male who presents for Chief Complaint  Patient presents with   fasting cpe    Fasitng cpe, no concerns. Flu shot today    Patient Care Team: Tysinger, Leward Quan as PCP - General (Family Medicine) [facility] Team: Neurology: Dr. Janece Canterbury Gastro: Dr. Scarlette Shorts Dermatology: Dr. Adelene Idler   Concerns: Hypertension-compliant with medication without complaint.  No chest pain, no palpitation, no edema  Hyperlipidemia-compliant with medication without complaint   Reviewed their medical, surgical, family, social, medication, and allergy history and updated chart as appropriate.  Past Medical History:  Diagnosis Date   Abnormal MRA, brain 02/23/13   107m small superior hypophoseal infundibulum (favored) vs aneurysm   Allergic rhinitis    Allergy    Dyslipidemia    H/O diagnostic ultrasound 8/14   carotid, normal   H/O echocardiogram 7/14   TTE, normal LV function, concentric LV moderate hypertrophy, 65-70% EF   Heart murmur    History of MRI of brain and brain stem 02/23/13   Normal noncontrast MRI appearance of the brain. 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 JScarlette Shorts  WISDOM TOOTH EXTRACTION     with sedation    Family History  Problem Relation Age of Onset   Diabetes Father    Hypertension Father    Stroke Father    AAA (abdominal aortic aneurysm) Father    Diabetes Brother    Hyperlipidemia Mother    Hypertension Mother    Other Brother        brain injury s/p MVA   High blood pressure Other    Prostate cancer Paternal Grandfather    Cancer Maternal  Grandmother        liver   Colon cancer Neg Hx    Rectal cancer Neg Hx    Stomach cancer Neg Hx    Esophageal cancer Neg Hx    Heart disease Neg Hx      Current Outpatient Medications:    amLODipine-olmesartan (AZOR) 5-20 MG tablet, Take 1 tablet by mouth daily., Disp: 90 tablet, Rfl: 3   aspirin 81 MG EC tablet, Take 1 tablet (81 mg total) by mouth daily. Swallow whole., Disp: 90 tablet, Rfl: 3   rosuvastatin (CRESTOR) 20 MG tablet, Take 1 tablet (20 mg total) by mouth at bedtime., Disp: 90 tablet, Rfl: 3  No Known Allergies   Review of Systems Constitutional: -fever, -chills, -sweats, -unexpected weight change, -decreased appetite, -fatigue Allergy: -sneezing, -itching, -congestion Dermatology: -changing moles, --rash, -lumps ENT: -runny nose, -ear pain, -sore throat, -hoarseness, -sinus pain, -teeth pain, - ringing in ears, -hearing loss, -nosebleeds Cardiology: -chest pain, -palpitations, -swelling, -difficulty breathing when lying flat, -waking up short of breath Respiratory: -cough, -shortness of breath, -difficulty breathing with exercise or exertion, -wheezing, -coughing up blood Gastroenterology: -abdominal pain, -nausea, -vomiting, -diarrhea, -constipation, -blood in stool, -changes in bowel movement, -difficulty swallowing or eating Hematology: -bleeding, -bruising  Musculoskeletal: -joint aches, -muscle aches, -joint swelling, -back pain, -neck pain, -cramping, -changes in gait Ophthalmology: denies vision changes, eye redness, itching, discharge  Urology: -burning with urination, -difficulty urinating, -blood in urine, -urinary frequency, -urgency, -incontinence Neurology: -headache, -weakness, -tingling, -numbness, -memory loss, -falls, -dizziness Psychology: -depressed mood, -agitation, -sleep problems Male GU: no testicular mass, pain, no lymph nodes swollen, no swelling, no rash.  Depression screen [facility] 2/9 05/25/2021 05/24/2020 05/24/2019 05/20/2018 09/24/2016  Decreased  Interest 0 0 0 0 0  Down, Depressed, Hopeless 0 0 0 0 0  PHQ - 2 Score 0 0 0 0 0        Objective:  BP 122/80   Pulse 85   Ht 5' 6" (1.676 m)   Wt 184 lb 3.2 oz (83.6 kg)   BMI 29.73 kg/m   General appearance: alert, no distress, WD/WN, African American male Skin: no worrisome lesions HEENT: normocephalic, conjunctiva/corneas normal, sclerae anicteric, PERRLA, EOMi Neck: supple, palpable submandibular nodes, but no specific lymphadenopathy, no thyromegaly, no masses, normal ROM, no bruits Chest: non tender, normal shape and expansion Heart: RRR, normal S1, S2, no murmurs Lungs: CTA bilaterally, no wheezes, rhonchi, or rales Abdomen: +bs, soft, non tender, non distended, no masses, no hepatomegaly, no splenomegaly, no bruits Back: non tender, normal ROM, no scoliosis Musculoskeletal: upper extremities non tender, no obvious deformity, normal ROM throughout, lower extremities non tender, no obvious deformity, normal ROM throughout Extremities: no edema, no cyanosis, no clubbing Pulses: 2+ symmetric, upper and lower extremities, normal cap refill Neurological: alert, oriented x 3, CN2-12 intact, strength normal upper extremities and lower extremities, sensation normal throughout, DTRs 2+ throughout, no cerebellar signs, gait normal Psychiatric: normal affect, behavior normal, pleasant  GU: normal male external genitalia,circumcised, nontender, no masses, no hernia, no lymphadenopathy Rectal: declined    Assessment and Plan :   Encounter Diagnoses  Name Primary?   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   Topics typically include healthy lifestyle, diet, exercise, preventative care, vaccinations, sick and well care, proper use of emergency dept and after hours care, as well as other concerns.     Recommendations: Continue to return yearly for your annual wellness and preventative care visits.  This gives [place] a chance to discuss healthy lifestyle, exercise, vaccinations, review your chart record, and perform screenings where appropriate.  I recommend you see your eye doctor yearly for routine vision care.  I recommend you see your dentist yearly for routine dental care including hygiene visits twice yearly.   Vaccination recommendations were reviewed Immunization History  Administered Date(s) Administered   Influenza Inj Mdck Quad Pf 05/11/2019   Influenza,inj,Quad PF,6+ Mos 05/24/2020, 05/25/2021   Influenza-Unspecified 05/11/2019   PFIZER(Purple Top)SARS-COV-2 Vaccination 03/21/2020, 04/11/2020   Pneumococcal Polysaccharide-23 05/24/2019   Tdap 09/22/2015    Counseled on the influenza virus vaccine.  Vaccine information sheet given.  Influenza vaccine given after consent obtained.  Shingles vaccine:  I recommend you have a shingles vaccine  to help prevent shingles or herpes zoster outbreak.   Please call your insurer to inquire about coverage for the Shingrix vaccine given in 2 doses.   Some insurers cover this vaccine after age 70, some cover this after age 35.  If your insurer covers this, then call to schedule appointment to have this vaccine here.    Screening for cancer: Colon cancer screening: I reviewed your colonoscopy on file that is up to date  You were given stool cards kit to return for hemoccult screening  We discussed PSA, prostate exam, and  prostate cancer screening risks/benefits.     Skin cancer screening: Check your skin regularly for new changes, growing lesions, or other lesions of concern Come in for evaluation if you have skin lesions of concern.  Lung cancer screening: If you have a greater than 20 pack year history of tobacco use, then you may qualify for lung cancer screening with a chest CT scan.   Please call your insurance company to inquire about coverage for this test.  We currently don't have screenings for other cancers besides breast, cervical, colon, and lung cancers.  If you have a strong family history of cancer or have other cancer screening concerns, please let me know.    Bone health: Get at least 150 minutes of aerobic exercise weekly Get weight bearing exercise at least once weekly Bone density test:  A bone density test is an imaging test that uses a type of X-ray to measure the amount of calcium and other minerals in your bones. The test may be used to diagnose or screen you for a condition that causes weak or thin bones (osteoporosis), predict your risk for a broken bone (fracture), or determine how well your osteoporosis treatment is working. The bone density test is recommended for females 78 and older, or females or males <48 if certain risk factors such as thyroid disease, long term use of steroids such as for asthma or rheumatological issues, vitamin D deficiency, estrogen deficiency, family history of osteoporosis, self or family history of fragility fracture in first degree relative.    Heart health: Get at least 150 minutes of aerobic exercise weekly Limit alcohol It is important to maintain a healthy blood pressure and healthy cholesterol numbers  Heart disease screening: Screening for heart disease includes screening for blood pressure, fasting lipids, glucose/diabetes screening, BMI height to weight ratio, reviewed of smoking status, physical activity, and diet.    Goals include  blood pressure 120/80 or less, maintaining a healthy lipid/cholesterol profile, preventing diabetes or keeping diabetes numbers under good control, not smoking or using tobacco products, exercising most days per week or at least 150 minutes per week of exercise, and eating healthy variety of fruits and vegetables, healthy oils, and avoiding unhealthy food choices like fried food, fast food, high sugar and high cholesterol foods.     We will order CT coronary score test   Medical care options: I recommend you continue to seek care here first for routine care.  We try really hard to have available appointments Monday through Friday daytime hours for sick visits, acute visits, and physicals.  Urgent care should be used for after hours and weekends for significant issues that cannot wait till the next day.  The emergency department should be used for significant potentially life-threatening emergencies.  The emergency department is expensive, can often have long wait times for less significant concerns, so try to utilize primary care, urgent care, or telemedicine when possible to  avoid unnecessary trips to the emergency department.  Virtual visits and telemedicine have been introduced since the pandemic started in 2020, and can be convenient ways to receive medical care.  We offer virtual appointments as well to assist you in a variety of options to seek medical care.   Separate significant issues discussed:  Hypertension-continue current medication  Hyperlipidemia-continue current medication, routine labs today  BPH-no complaint  Impaired glucose-routine labs today  History lymphoma-advised routine follow-up with his dermatologist.  I reviewed recent notes in the chart record regarding this follow-up  History of TIA- continue current therapy   Carnell was seen today for fasting cpe.  Diagnoses and all orders for this visit:  Encounter for health maintenance examination in adult -      Comprehensive metabolic panel -     CBC with Differential/Platelet -     Lipid panel -     Hemoglobin A1c -     PSA -     CT CARDIAC SCORING ([facility] LOCATIONS ONLY); Future  Dyslipidemia -     Comprehensive metabolic panel -     Lipid panel -     CT CARDIAC SCORING ([facility] LOCATIONS ONLY); Future  BPH without urinary obstruction  Allergic rhinitis, unspecified seasonality, unspecified trigger  Essential hypertension, benign -     Comprehensive metabolic panel -     CT CARDIAC SCORING ([facility] LOCATIONS ONLY); Future  Family history of abdominal aortic aneurysm (AAA)  History of lymphoma  History of TIA (transient ischemic attack)  Impaired fasting blood sugar -     Hemoglobin A1c  Screening for prostate cancer -     PSA  Screen for colon cancer -     Fecal occult blood, imunochemical  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

## 2021-05-25 NOTE — Patient Instructions (Addendum)
Optometrists nearby  [facility] [address] Loni Muse [place], [place] [address] [phone]  Dr. Blair Heys [address] [place], [place] [address] [phone]  [facility] [address] Jacinto Reap [place], [place] [address] [phone]  [facility], Dr. Idolina Primer [address] [place], [place] [address] [phone]     This visit was a preventative care visit, also known as wellness visit or routine physical.   Topics typically include healthy lifestyle, diet, exercise, preventative care, vaccinations, sick and well care, proper use of emergency dept and after hours care, as well as other concerns.     Recommendations: Continue to return yearly for your annual wellness and preventative care visits.  This gives [place] a chance to discuss healthy lifestyle, exercise, vaccinations, review your chart record, and perform screenings where appropriate.  I recommend you see your eye doctor yearly for routine vision care.  I recommend you see your dentist yearly for routine dental care including hygiene visits twice yearly.   Vaccination recommendations were reviewed Immunization History  Administered Date(s) Administered   Influenza Inj Mdck Quad Pf 05/11/2019   Influenza,inj,Quad PF,6+ Mos 05/24/2020   Influenza-Unspecified 05/11/2019   PFIZER(Purple Top)SARS-COV-2 Vaccination 03/21/2020, 04/11/2020   Pneumococcal Polysaccharide-23 05/24/2019   Tdap 09/22/2015    Counseled on the influenza virus vaccine.  Vaccine information sheet given.  Influenza vaccine given after consent obtained.  Shingles vaccine:  I recommend you have a shingles vaccine to help prevent shingles or herpes zoster outbreak.   Please call your insurer to inquire about coverage for the Shingrix vaccine given in 2 doses.   Some insurers cover this vaccine after age 29, some cover this after age 43.  If your insurer covers this, then call to schedule appointment to have  this vaccine here.    Screening for cancer: Colon cancer screening: I reviewed your colonoscopy on file that is up to date  You were given stool cards kit to return for hemoccult screening  We discussed PSA, prostate exam, and prostate cancer screening risks/benefits.     Skin cancer screening: Check your skin regularly for new changes, growing lesions, or other lesions of concern Come in for evaluation if you have skin lesions of concern.  Lung cancer screening: If you have a greater than 20 pack year history of tobacco use, then you may qualify for lung cancer screening with a chest CT scan.   Please call your insurance company to inquire about coverage for this test.  We currently don't have screenings for other cancers besides breast, cervical, colon, and lung cancers.  If you have a strong family history of cancer or have other cancer screening concerns, please let me know.    Bone health: Get at least 150 minutes of aerobic exercise weekly Get weight bearing exercise at least once weekly Bone density test:  A bone density test is an imaging test that uses a type of X-ray to measure the amount of calcium and other minerals in your bones. The test may be used to diagnose or screen you for a condition that causes weak or thin bones (osteoporosis), predict your risk for a broken bone (fracture), or determine how well your osteoporosis treatment is working. The bone density test is recommended for females 12 and older, or females or males <34 if certain risk factors such as thyroid disease, long term use of steroids such as for asthma or rheumatological issues, vitamin D deficiency, estrogen deficiency, family history of osteoporosis,  self or family history of fragility fracture in first degree relative.    Heart health: Get at least 150 minutes of aerobic exercise weekly Limit alcohol It is important to maintain a healthy blood pressure and healthy cholesterol numbers  Heart  disease screening: Screening for heart disease includes screening for blood pressure, fasting lipids, glucose/diabetes screening, BMI height to weight ratio, reviewed of smoking status, physical activity, and diet.    Goals include blood pressure 120/80 or less, maintaining a healthy lipid/cholesterol profile, preventing diabetes or keeping diabetes numbers under good control, not smoking or using tobacco products, exercising most days per week or at least 150 minutes per week of exercise, and eating healthy variety of fruits and vegetables, healthy oils, and avoiding unhealthy food choices like fried food, fast food, high sugar and high cholesterol foods.     We will order CT coronary score test   Medical care options: I recommend you continue to seek care here first for routine care.  We try really hard to have available appointments Monday through Friday daytime hours for sick visits, acute visits, and physicals.  Urgent care should be used for after hours and weekends for significant issues that cannot wait till the next day.  The emergency department should be used for significant potentially life-threatening emergencies.  The emergency department is expensive, can often have long wait times for less significant concerns, so try to utilize primary care, urgent care, or telemedicine when possible to avoid unnecessary trips to the emergency department.  Virtual visits and telemedicine have been introduced since the pandemic started in 2020, and can be convenient ways to receive medical care.  We offer virtual appointments as well to assist you in a variety of options to seek medical care.   Separate significant issues discussed:  Hypertension-continue current medicatino  Hyperlipidemia-continue current medication, routine labs today  BPH-no complaint  Impaired glucose-routine labs today  History lymphoma-advised routine follow-up with his dermatologist.  I reviewed recent notes in the chart  record regarding this follow-up  History of TIA- continue current therapy

## 2021-05-26 LAB — PSA: Prostate Specific Ag, Serum: 0.5 ng/mL (ref 0.0–4.0)

## 2021-05-26 LAB — LIPID PANEL
Chol/HDL Ratio: 2.5 ratio (ref 0.0–5.0)
Cholesterol, Total: 131 mg/dL (ref 100–199)
HDL: 53 mg/dL (ref >39)
LDL Chol Calc (NIH): 64 mg/dL (ref 0–99)
Triglycerides: 70 mg/dL (ref 0–149)
VLDL Cholesterol Cal: 14 mg/dL (ref 5–40)

## 2021-05-26 LAB — CBC WITH DIFFERENTIAL/PLATELET
Basophils Absolute: 0 10*3/uL (ref 0.0–0.2)
Basos: 1 %
EOS (ABSOLUTE): 0.2 10*3/uL (ref 0.0–0.4)
Eos: 3 %
Hematocrit: 46.4 % (ref 37.5–51.0)
Hemoglobin: 15.5 g/dL (ref 13.0–17.7)
Immature Grans (Abs): 0 10*3/uL (ref 0.0–0.1)
Immature Granulocytes: 0 %
Lymphocytes Absolute: 1.5 10*3/uL (ref 0.7–3.1)
Lymphs: 27 %
MCH: 31 pg (ref 26.6–33.0)
MCHC: 33.4 g/dL (ref 31.5–35.7)
MCV: 93 fL (ref 79–97)
Monocytes Absolute: 0.6 10*3/uL (ref 0.1–0.9)
Monocytes: 11 %
Neutrophils Absolute: 3.1 10*3/uL (ref 1.4–7.0)
Neutrophils: 58 %
Platelets: 192 10*3/uL (ref 150–450)
RBC: 5 x10E6/uL (ref 4.14–5.80)
RDW: 14 % (ref 11.6–15.4)
WBC: 5.4 10*3/uL (ref 3.4–10.8)

## 2021-05-26 LAB — COMPREHENSIVE METABOLIC PANEL
ALT: 19 IU/L (ref 0–44)
AST: 23 IU/L (ref 0–40)
Albumin/Globulin Ratio: 2.1 (ref 1.2–2.2)
Albumin: 4.7 g/dL (ref 3.8–4.9)
Alkaline Phosphatase: 64 IU/L (ref 44–121)
BUN/Creatinine Ratio: 10 (ref 9–20)
BUN: 11 mg/dL (ref 6–24)
Bilirubin Total: 0.5 mg/dL (ref 0.0–1.2)
CO2: 18 mmol/L — ABNORMAL LOW (ref 20–29)
Calcium: 9.2 mg/dL (ref 8.7–10.2)
Chloride: 104 mmol/L (ref 96–106)
Creatinine, Ser: 1.09 mg/dL (ref 0.76–1.27)
Globulin, Total: 2.2 g/dL (ref 1.5–4.5)
Glucose: 105 mg/dL — ABNORMAL HIGH (ref 70–99)
Potassium: 3.9 mmol/L (ref 3.5–5.2)
Sodium: 141 mmol/L (ref 134–144)
Total Protein: 6.9 g/dL (ref 6.0–8.5)
eGFR: 78 mL/min/{1.73_m2} (ref >59)

## 2021-05-26 LAB — HEMOGLOBIN A1C
Est. average glucose Bld gHb Est-mCnc: 126 mg/dL
Hgb A1c MFr Bld: 6 % — ABNORMAL HIGH (ref 4.8–5.6)

## 2021-06-07 ENCOUNTER — Other Ambulatory Visit: Payer: Self-pay | Admitting: Medical

## 2021-06-15 ENCOUNTER — Ambulatory Visit
Admission: RE | Admit: 2021-06-15 | Discharge: 2021-06-15 | Disposition: A | Discharge status: Home / Self Care | Payer: No Typology Code available for payment source | Source: Ambulatory Visit | Attending: Medical | Admitting: Medical

## 2021-06-15 DIAGNOSIS — E785 Hyperlipidemia, unspecified: Secondary | ICD-10-CM

## 2021-06-15 DIAGNOSIS — Z0000 Encounter for general adult medical examination without abnormal findings: Secondary | ICD-10-CM

## 2021-06-15 DIAGNOSIS — Z136 Encounter for screening for cardiovascular disorders: Secondary | ICD-10-CM

## 2021-06-15 DIAGNOSIS — I10 Essential (primary) hypertension: Secondary | ICD-10-CM

## 2021-06-18 ENCOUNTER — Encounter: Payer: Self-pay | Admitting: Internal Medicine

## 2021-06-18 ENCOUNTER — Other Ambulatory Visit: Payer: Self-pay | Admitting: Medical

## 2021-06-18 MED ORDER — ASPIRIN 81 MG PO TBEC: 81.0000 mg | DELAYED_RELEASE_TABLET | Freq: Every day | ORAL | 3 refills | Status: DC

## 2021-06-18 MED ORDER — ROSUVASTATIN CALCIUM 20 MG PO TABS: 20.0000 mg | ORAL_TABLET | Freq: Every day | ORAL | 3 refills | Status: DC

## 2021-06-25 ENCOUNTER — Ambulatory Visit (INDEPENDENT_AMBULATORY_CARE_PROVIDER_SITE_OTHER): Payer: BC Managed Care – PPO | Admitting: Medical

## 2021-06-25 ENCOUNTER — Other Ambulatory Visit: Payer: Self-pay

## 2021-06-25 VITALS — BP 122/74 | HR 89 | Wt 181.4 lb

## 2021-06-25 DIAGNOSIS — E785 Hyperlipidemia, unspecified: Secondary | ICD-10-CM | POA: Diagnosis not present

## 2021-06-25 DIAGNOSIS — I10 Essential (primary) hypertension: Secondary | ICD-10-CM | POA: Diagnosis not present

## 2021-06-25 NOTE — Progress Notes (Signed)
Subjective:  Hayden Huang Sr. is a 60 y.o. male who presents for Chief Complaint  Patient presents with   ct results    Discuss CT results     Here to discuss recent CT scan, coronary calcium score  Hypertension-compliant with medication  Hyperlipidemia-compliant with rosuvastatin 20 mg daily and has been on this quite a while  Non-smoker.  Negative alcohol use.  No cardiac symptoms, no chest pain, no shortness of breath, no DOE, no palpitation, no edema.  No other aggravating or relieving factors.    No other c/o.  Past Medical History:  Diagnosis Date   Abnormal MRA, brain 02/23/13   53mm small superior hypophoseal infundibulum (favored) vs aneurysm   Allergic rhinitis    Allergy    Dyslipidemia    H/O diagnostic ultrasound 8/14   carotid, normal   H/O echocardiogram 7/14   TTE, normal LV function, concentric LV moderate hypertrophy, 65-70% EF   Heart murmur    History of MRI of brain and brain stem 02/23/13   Normal noncontrast MRI appearance of the brain. 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[place])    Stroke [facility])    TIA 02-2013   TIA (transient ischemic attack) 7/14   Family History  Problem Relation Age of Onset   Diabetes Father    Hypertension Father    Stroke Father    AAA (abdominal aortic aneurysm) Father    Diabetes Brother    Hyperlipidemia Mother    Hypertension Mother    Other Brother        brain injury s/p MVA   High blood pressure Other    Prostate cancer Paternal Grandfather    Cancer Maternal Grandmother        liver   Colon cancer Neg Hx    Rectal cancer Neg Hx    Stomach cancer Neg Hx    Esophageal cancer Neg Hx    Heart disease Neg Hx    Current Outpatient Medications on File Prior to Visit  Medication Sig Dispense Refill   amLODipine-olmesartan (AZOR) 5-20 MG tablet  TAKE 1 TABLET BY MOUTH EVERY DAY 90 tablet 2   aspirin 81 MG EC tablet Take 1 tablet (81 mg total) by mouth daily. Swallow whole. 90 tablet 3   rosuvastatin (CRESTOR) 20 MG tablet Take 1 tablet (20 mg total) by mouth at bedtime. 90 tablet 3   No current facility-administered medications on file prior to visit.     The following portions of the patient's history were reviewed and updated as appropriate: allergies, current medications, past family history, past medical history, past social history, past surgical history and problem list.  ROS Otherwise as in subjective above  Objective: BP 122/74   Pulse 89   Wt 181 lb 6.4 oz (82.3 kg)   BMI 29.28 kg/m   General appearance: alert, no distress, well developed, well nourished Neck: supple, no lymphadenopathy, no thyromegaly, no masses, no JVD or bruits Heart: RRR, normal S1, S2, no murmurs Lungs: CTA bilaterally, no wheezes, rhonchi, or rales Pulses: 2+ radial pulses, 2+ pedal pulses, normal cap refill Ext: no edema  CT coronary artery calcium score 06/15/21:  FINDINGS: CORONARY CALCIUM SCORES:   Left Main: 21.3   LAD: 146   LCx: 36.1   RCA: 22.3   Total Agatston Score: 226  MESA database percentile: 51  IMPRESSION: Coronary calcium score of 226 is at the 93rd percentile for the patient's age, sex and race.   Assessment: Encounter Diagnoses  Name Primary?   Essential hypertension, benign Yes   Dyslipidemia      Plan: We discussed the CT coronary calcium scare and answered his questions.  Discussed low-cholesterol diet, continue regular.  Continue current medications.  We discussed symptoms of MI/ACS.  He has no symptoms.     [name] was seen today for ct results.  Diagnoses and all orders for this visit:  Essential hypertension, benign  Dyslipidemia   Follow up: yearly for physical

## 2021-08-02 ENCOUNTER — Encounter: Payer: Self-pay | Admitting: Family Medicine

## 2021-08-02 ENCOUNTER — Other Ambulatory Visit: Payer: Self-pay

## 2021-08-02 ENCOUNTER — Telehealth (INDEPENDENT_AMBULATORY_CARE_PROVIDER_SITE_OTHER): Payer: BC Managed Care – PPO | Admitting: Family Medicine

## 2021-08-02 VITALS — Ht 66.0 in | Wt 181.0 lb

## 2021-08-02 DIAGNOSIS — J40 Bronchitis, not specified as acute or chronic: Secondary | ICD-10-CM | POA: Diagnosis not present

## 2021-08-02 DIAGNOSIS — J329 Chronic sinusitis, unspecified: Secondary | ICD-10-CM | POA: Diagnosis not present

## 2021-08-02 DIAGNOSIS — J309 Allergic rhinitis, unspecified: Secondary | ICD-10-CM | POA: Diagnosis not present

## 2021-08-02 MED ORDER — AMOXICILLIN-POT CLAVULANATE 875-125 MG PO TABS: 1.0000 | ORAL_TABLET | Freq: Two times a day (BID) | ORAL | 0 refills | Status: DC

## 2021-08-02 NOTE — Progress Notes (Signed)
° °  Subjective:    Patient ID: Hayden Kehr Sr., male    DOB: [birth year], 60 y.o.   MRN: [id number]  HPI Documentation for virtual audio and video telecommunications through [place] encounter: The patient was located at home. 2 patient identifiers used.  The provider was located in the office. The patient did consent to this visit and is aware of possible charges through their insurance for this visit. The other persons participating in this telemedicine service were none. Time spent on call was 6 minutes and in review of previous records >18 minutes total for counseling and coordination of care. This virtual service is not related to other E/M service within previous 7 days. . He has a several week history of sore throat and subsequent hoarse voice.  In the last 5 days this has gotten worse.  He did get COVID tested recently which was negative.  He now has sinus pressure, PND and a productive cough but no fever or chills.  He does have underlying allergies and is on Claritin..   Review of Systems     Objective:   Physical Exam Alert and in no distress otherwise not examined       Assessment & Plan:  Sinusitis, unspecified chronicity, unspecified location - Plan: amoxicillin-clavulanate (AUGMENTIN) 875-125 MG tablet  Allergic rhinitis, unspecified seasonality, unspecified trigger  Bronchitis - Plan: amoxicillin-clavulanate (AUGMENTIN) 875-125 MG tablet I explained that his symptoms are consistent with a sinus infection as well as bronchitis.  He is to call me if not entirely better when he finishes the antibiotic.

## 2022-03-08 ENCOUNTER — Other Ambulatory Visit: Payer: Self-pay | Admitting: Medical

## 2022-03-08 NOTE — Telephone Encounter (Signed)
Has upcoming appt in September

## 2022-03-28 ENCOUNTER — Telehealth: Payer: BC Managed Care – PPO | Admitting: Family Medicine

## 2022-03-28 ENCOUNTER — Encounter: Payer: Self-pay | Admitting: Family Medicine

## 2022-03-28 VITALS — Ht 66.0 in | Wt 181.0 lb

## 2022-03-28 DIAGNOSIS — U071 COVID-19: Secondary | ICD-10-CM | POA: Diagnosis not present

## 2022-03-28 MED ORDER — MOLNUPIRAVIR EUA 200MG CAPSULE: 4.0000 | ORAL_CAPSULE | Freq: Two times a day (BID) | ORAL | 0 refills | Status: AC

## 2022-03-28 NOTE — Progress Notes (Signed)
Start time: 3:13 joined video visit--unable to see or hear pt. Multiple phone calls made, phone call at 3:18  End time: 3:28  Virtual Visit via Telephone Note  I connected with Hayden Huang Sr. on 03/28/22 by telephone and verified that I am speaking with the correct person using two identifiers.  Location: Patient: home Provider: office   I discussed the limitations of evaluation and management by telemedicine and the availability of in person appointments. The patient expressed understanding and agreed to proceed.  History of Present Illness:  Chief Complaint  Patient presents with   Acute Visit    Virtual- body aches and fatigue tested positive today at home.   Yesterday had slight phlegm in the throat, otherwise no particular symptoms. After getting to work today he started feeling achey, fatigue. He has a slight cough, postnasal drip. +chilled this morning.  Doesn't have a thermometer. Denies sinus pain.  Slight runny nose, clear. Cough is nonproductive. No shortness of breath. Tested + for COVID today.  Had initial COVID vaccines (2), no boosters.    PMH, PSH, SH reviewed  Pt with h/o TIA, T-cell lymphoma, HLD, HTN  Outpatient Encounter Medications as of 03/28/2022  Medication Sig Note   amLODipine-olmesartan (AZOR) 5-20 MG tablet TAKE 1 TABLET BY MOUTH EVERY DAY    aspirin 81 MG EC tablet Take 1 tablet (81 mg total) by mouth daily. Swallow whole.    rosuvastatin (CRESTOR) 20 MG tablet Take 1 tablet (20 mg total) by mouth at bedtime.    acetaminophen (TYLENOL) 500 MG tablet Take 500 mg by mouth every 6 (six) hours as needed. (Patient not taking: Reported on 03/28/2022) 03/28/2022: As needed   amoxicillin-clavulanate (AUGMENTIN) 875-125 MG tablet Take 1 tablet by mouth 2 (two) times daily. (Patient not taking: Reported on 03/28/2022)    loratadine (CLARITIN) 10 MG tablet Take 10 mg by mouth daily. (Patient not taking: Reported on 03/28/2022) 03/28/2022: As needed   No  facility-administered encounter medications on file as of 03/28/2022.   No Known Allergies  ROS: See HPI for URI symptoms. No sinus pain, no chest pain, shortness of breath, n/v/d, rashes.  +myalgias.   Observations/Objective:  Ht '5\' 6"'$  (1.676 m)   Wt 181 lb (82.1 kg)   BMI 29.21 kg/m   Patient is alert and oriented. He is speaking comfortably, no coughing noted during visit. Exam is limited due to the virtual nature of the visit.  Assessment and Plan:  COVID-19 virus infection - given underlying medical conditions, treat with antiviral. Supportive measures reviewed, and s/sx for which he should seek re-eval were discussed.  - Plan: molnupiravir EUA (LAGEVRIO) 200 mg CAPS capsule  Supportive measures--Mucinex-DM, tylenol/NSAID prn.  Isolation and masking recommendations reviewed. All questions answered.   Follow Up Instructions:    I discussed the assessment and treatment plan with the patient. The patient was provided an opportunity to ask questions and all were answered. The patient agreed with the plan and demonstrated an understanding of the instructions.   The patient was advised to call back or seek an in-person evaluation if the symptoms worsen or if the condition fails to improve as anticipated.  I spent 18 minutes dedicated to the care of this patient, including pre-visit review of records, face to face time, post-visit ordering of testing and documentation.    Vikki Ports, MD

## 2022-03-28 NOTE — Patient Instructions (Signed)
Stay well hydrated. Mucinex DM--get the 12 hour pill and take it twice daily.  This will help keep your mucus and phlegm thin. The DM portion is a cough suppressant. You may use tylenol and/or ibuprofen as needed for any fever or pain or headache. Get plenty of rest--listen to your body. Take the antiviral medication twice daily for 5 days.  Isolate Thursday through Monday. If you are feeling better (cough is improving, and no fevers) then you may leave the house, but still wear a mask 100% of the time when around others, from Tues through Saturday next week.  If you develop persistent fever, worsening headache, worsening cough, shortness of breath, chest pain, any neurologic symptoms (weakness, numbness), seek more immediate evaluation.

## 2022-04-24 ENCOUNTER — Encounter: Payer: Self-pay | Admitting: Internal Medicine

## 2022-05-27 ENCOUNTER — Encounter: Payer: Self-pay | Admitting: Medical

## 2022-05-27 ENCOUNTER — Ambulatory Visit: Payer: BC Managed Care – PPO | Admitting: Medical

## 2022-05-27 VITALS — BP 120/80 | HR 84 | Ht 66.0 in | Wt 181.8 lb

## 2022-05-27 DIAGNOSIS — E785 Hyperlipidemia, unspecified: Secondary | ICD-10-CM | POA: Diagnosis not present

## 2022-05-27 DIAGNOSIS — Z8249 Family history of ischemic heart disease and other diseases of the circulatory system: Secondary | ICD-10-CM

## 2022-05-27 DIAGNOSIS — Z23 Encounter for immunization: Secondary | ICD-10-CM

## 2022-05-27 DIAGNOSIS — N400 Enlarged prostate without lower urinary tract symptoms: Secondary | ICD-10-CM

## 2022-05-27 DIAGNOSIS — Z0000 Encounter for general adult medical examination without abnormal findings: Secondary | ICD-10-CM

## 2022-05-27 DIAGNOSIS — R7301 Impaired fasting glucose: Secondary | ICD-10-CM

## 2022-05-27 DIAGNOSIS — Z1211 Encounter for screening for malignant neoplasm of colon: Secondary | ICD-10-CM

## 2022-05-27 DIAGNOSIS — Z8572 Personal history of non-Hodgkin lymphomas: Secondary | ICD-10-CM

## 2022-05-27 DIAGNOSIS — Z7185 Encounter for immunization safety counseling: Secondary | ICD-10-CM | POA: Diagnosis not present

## 2022-05-27 DIAGNOSIS — Z8673 Personal history of transient ischemic attack (TIA), and cerebral infarction without residual deficits: Secondary | ICD-10-CM

## 2022-05-27 DIAGNOSIS — Z125 Encounter for screening for malignant neoplasm of prostate: Secondary | ICD-10-CM

## 2022-05-27 DIAGNOSIS — I10 Essential (primary) hypertension: Secondary | ICD-10-CM

## 2022-05-27 NOTE — Patient Instructions (Signed)
This visit was a preventative care visit, also known as wellness visit or routine physical.   Topics typically include healthy lifestyle, diet, exercise, preventative care, vaccinations, sick and well care, proper use of emergency dept and after hours care, as well as other concerns.     Recommendations: Continue to return yearly for your annual wellness and preventative care visits.  This gives [place] a chance to discuss healthy lifestyle, exercise, vaccinations, review your chart record, and perform screenings where appropriate.  I recommend you see your eye doctor yearly for routine vision care.  I recommend you see your dentist yearly for routine dental care including hygiene visits twice yearly.   Vaccination recommendations were reviewed Immunization History  Administered Date(s) Administered   Influenza Inj Mdck Quad Pf 05/11/2019   Influenza,inj,Quad PF,6+ Mos 05/24/2020, 05/25/2021   Influenza-Unspecified 05/11/2019   PFIZER(Purple Top)SARS-COV-2 Vaccination 03/21/2020, 04/11/2020   Pneumococcal Polysaccharide-23 05/24/2019   Tdap 09/22/2015    Counseled on the influenza virus vaccine.  Vaccine information sheet given.  Influenza vaccine given after consent obtained.  Shingles vaccine:  I recommend you have a shingles vaccine to help prevent shingles or herpes zoster outbreak.   Please call your insurer to inquire about coverage for the Shingrix vaccine given in 2 doses.   Some insurers cover this vaccine after age 57, some cover this after age 58.  If your insurer covers this, then call to schedule appointment to have this vaccine here.    Screening for cancer: Colon cancer screening: I reviewed your colonoscopy on file that is up to date  You were given stool cards kit to return for hemoccult screening  We discussed PSA, prostate exam, and prostate cancer screening risks/benefits.     Skin cancer screening: Check your skin regularly for new changes, growing lesions, or  other lesions of concern Come in for evaluation if you have skin lesions of concern.  Lung cancer screening: If you have a greater than 20 pack year history of tobacco use, then you may qualify for lung cancer screening with a chest CT scan.   Please call your insurance company to inquire about coverage for this test.  We currently don't have screenings for other cancers besides breast, cervical, colon, and lung cancers.  If you have a strong family history of cancer or have other cancer screening concerns, please let me know.    Bone health: Get at least 150 minutes of aerobic exercise weekly Get weight bearing exercise at least once weekly Bone density test:  A bone density test is an imaging test that uses a type of X-ray to measure the amount of calcium and other minerals in your bones. The test may be used to diagnose or screen you for a condition that causes weak or thin bones (osteoporosis), predict your risk for a broken bone (fracture), or determine how well your osteoporosis treatment is working. The bone density test is recommended for females 46 and older, or females or males <51 if certain risk factors such as thyroid disease, long term use of steroids such as for asthma or rheumatological issues, vitamin D deficiency, estrogen deficiency, family history of osteoporosis, self or family history of fragility fracture in first degree relative.    Heart health: Get at least 150 minutes of aerobic exercise weekly Limit alcohol It is important to maintain a healthy blood pressure and healthy cholesterol numbers  Heart disease screening: Screening for heart disease includes screening for blood pressure, fasting lipids, glucose/diabetes screening, BMI height  to weight ratio, reviewed of smoking status, physical activity, and diet.    Goals include blood pressure 120/80 or less, maintaining a healthy lipid/cholesterol profile, preventing diabetes or keeping diabetes numbers under good  control, not smoking or using tobacco products, exercising most days per week or at least 150 minutes per week of exercise, and eating healthy variety of fruits and vegetables, healthy oils, and avoiding unhealthy food choices like fried food, fast food, high sugar and high cholesterol foods.      Medical care options: I recommend you continue to seek care here first for routine care.  We try really hard to have available appointments Monday through Friday daytime hours for sick visits, acute visits, and physicals.  Urgent care should be used for after hours and weekends for significant issues that cannot wait till the next day.  The emergency department should be used for significant potentially life-threatening emergencies.  The emergency department is expensive, can often have long wait times for less significant concerns, so try to utilize primary care, urgent care, or telemedicine when possible to avoid unnecessary trips to the emergency department.  Virtual visits and telemedicine have been introduced since the pandemic started in 2020, and can be convenient ways to receive medical care.  We offer virtual appointments as well to assist you in a variety of options to seek medical care.   Separate significant issues discussed:  Hypertension-continue current medication  Hyperlipidemia-continue current medication, routine labs today  BPH-no complaint  Impaired glucose-routine labs today  History lymphoma-advised routine follow-up with his dermatologist.  I reviewed recent notes in the chart record regarding this follow-up  History of TIA- continue current therapy

## 2022-05-27 NOTE — Progress Notes (Signed)
Subjective:   HPI  Hayden SCHOLZ Sr. is a 61 y.o. male who presents for Chief Complaint  Patient presents with   fasting cpe    Fasting cpe, no concerns. Would like flu shot today    Patient Care Team: Zia Kanner, Leward Quan as PCP - General (Family Medicine) Healthcare Team: Neurology: Dr. Janece Canterbury Gastro: Dr. Scarlette Shorts Dermatology: Dr. Adelene Idler   Concerns: Hypertension-compliant with medication without complaint.  No chest pain, no palpitation, no edema  Hyperlipidemia-compliant with medication without complaint   Reviewed their medical, surgical, family, social, medication, and allergy history and updated chart as appropriate.  Past Medical History:  Diagnosis Date   Abnormal MRA, brain 02/23/13   23m small superior hypophoseal infundibulum (favored) vs aneurysm   Allergic rhinitis    Allergy    Dyslipidemia    H/O diagnostic ultrasound 8/14   carotid, normal   H/O echocardiogram 7/14   TTE, normal LV function, concentric LV moderate hypertrophy, 65-70% EF   Heart murmur    History of MRI of brain and brain stem 02/23/13   Normal noncontrast MRI appearance of the brain. 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 JScarlette Shorts  WISDOM TOOTH EXTRACTION     with sedation    Family History  Problem Relation Age of Onset   Diabetes Father    Hypertension Father    Stroke Father    AAA (abdominal aortic aneurysm) Father    Diabetes Brother    Hyperlipidemia Mother    Hypertension Mother    Other Brother        brain injury s/p MVA   High blood pressure Other    Prostate cancer Paternal Grandfather    Cancer  Maternal Grandmother        liver   Colon cancer Neg Hx    Rectal cancer Neg Hx    Stomach cancer Neg Hx    Esophageal cancer Neg Hx    Heart disease Neg Hx      Current Outpatient Medications:    amLODipine-olmesartan (AZOR) 5-20 MG tablet, TAKE 1 TABLET BY MOUTH EVERY DAY, Disp: 90 tablet, Rfl: 0   aspirin 81 MG EC tablet, Take 1 tablet (81 mg total) by mouth daily. Swallow whole., Disp: 90 tablet, Rfl: 3   rosuvastatin (CRESTOR) 20 MG tablet, Take 1 tablet (20 mg total) by mouth at bedtime., Disp: 90 tablet, Rfl: 3  No Known Allergies   Review of Systems Constitutional: -fever, -chills, -sweats, -unexpected weight change, -decreased appetite, -fatigue Allergy: -sneezing, -itching, -congestion Dermatology: -changing moles, --rash, -lumps ENT: -runny nose, -ear pain, -sore throat, -hoarseness, -sinus pain, -teeth pain, - ringing in ears, -hearing loss, -nosebleeds Cardiology: -chest pain, -palpitations, -swelling, -difficulty breathing when lying flat, -waking up short of breath Respiratory: -cough, -shortness of breath, -difficulty breathing with exercise or exertion, -wheezing, -coughing up blood Gastroenterology: -abdominal pain, -nausea, -vomiting, -diarrhea, -constipation, -blood in stool, -changes in bowel movement, -difficulty swallowing or eating Hematology: -bleeding, -bruising  Musculoskeletal: -joint aches, -muscle aches, -joint swelling, -back pain, -neck pain, -cramping, -changes in gait Ophthalmology: denies vision changes, eye  redness, itching, discharge Urology: -burning with urination, -difficulty urinating, -blood in urine, -urinary frequency, -urgency, -incontinence Neurology: -headache, -weakness, -tingling, -numbness, -memory loss, -falls, -dizziness Psychology: -depressed mood, -agitation, -sleep problems Male GU: no testicular mass, pain, no lymph nodes swollen, no swelling, no rash.     05/27/2022    8:22 AM 05/25/2021    8:29 AM 05/24/2020    9:19 AM  05/24/2019    9:50 AM 05/20/2018    9:42 AM  Depression screen PHQ 2/9  Decreased Interest 0 0 0 0 0  Down, Depressed, Hopeless 0 0 0 0 0  PHQ - 2 Score 0 0 0 0 0        Objective:  BP 120/80   Pulse 84   Ht '5\' 6"'  (1.676 m)   Wt 181 lb 12.8 oz (82.5 kg)   BMI 29.34 kg/m   General appearance: alert, no distress, WD/WN, African American male Skin: no worrisome lesions HEENT: normocephalic, conjunctiva/corneas normal, sclerae anicteric, PERRLA, EOMi Neck: supple, palpable submandibular nodes, but no specific lymphadenopathy, no thyromegaly, no masses, normal ROM, no bruits Chest: non tender, normal shape and expansion Heart: RRR, normal S1, S2, no murmurs Lungs: CTA bilaterally, no wheezes, rhonchi, or rales Abdomen: +bs, soft, non tender, non distended, no masses, no hepatomegaly, no splenomegaly, no bruits Back: non tender, normal ROM, no scoliosis Musculoskeletal: upper extremities non tender, no obvious deformity, normal ROM throughout, lower extremities non tender, no obvious deformity, normal ROM throughout Extremities: no edema, no cyanosis, no clubbing Pulses: 2+ symmetric, upper and lower extremities, normal cap refill Neurological: alert, oriented x 3, CN2-12 intact, strength normal upper extremities and lower extremities, sensation normal throughout, DTRs 2+ throughout, no cerebellar signs, gait normal Psychiatric: normal affect, behavior normal, pleasant  GU: normal male external genitalia,circumcised, nontender, no masses, no hernia, no lymphadenopathy Rectal: declined    Assessment and Plan :   Encounter Diagnoses  Name Primary?   Encounter for health maintenance examination in adult Yes   Dyslipidemia    BPH without urinary obstruction    Vaccine counseling    History of TIA (transient ischemic attack)    History of lymphoma    Family history of abdominal aortic aneurysm (AAA)    Essential hypertension, benign    Impaired fasting blood sugar    Screening  for prostate cancer    Screen for colon cancer      This visit was a preventative care visit, also known as wellness visit or routine physical.   Topics typically include healthy lifestyle, diet, exercise, preventative care, vaccinations, sick and well care, proper use of emergency dept and after hours care, as well as other concerns.     Recommendations: Continue to return yearly for your annual wellness and preventative care visits.  This gives [place] a chance to discuss healthy lifestyle, exercise, vaccinations, review your chart record, and perform screenings where appropriate.  I recommend you see your eye doctor yearly for routine vision care.  I recommend you see your dentist yearly for routine dental care including hygiene visits twice yearly.   Vaccination recommendations were reviewed Immunization History  Administered Date(s) Administered   Influenza Inj Mdck Quad Pf 05/11/2019   Influenza,inj,Quad PF,6+ Mos 05/24/2020, 05/25/2021, 05/27/2022   Influenza-Unspecified 05/11/2019   PFIZER(Purple Top)SARS-COV-2 Vaccination 03/21/2020, 04/11/2020   Pneumococcal Polysaccharide-23 05/24/2019   Tdap 09/22/2015    Counseled on the influenza virus vaccine.  Vaccine information sheet given.  Influenza vaccine given after consent obtained.  Shingles vaccine:  I  recommend you have a shingles vaccine to help prevent shingles or herpes zoster outbreak.   Please call your insurer to inquire about coverage for the Shingrix vaccine given in 2 doses.   Some insurers cover this vaccine after age 48, some cover this after age 6.  If your insurer covers this, then call to schedule appointment to have this vaccine here.    Screening for cancer: Colon cancer screening: I reviewed your colonoscopy on file that is up to date  You were given stool cards kit to return for hemoccult screening  We discussed PSA, prostate exam, and prostate cancer screening risks/benefits.     Skin cancer  screening: Check your skin regularly for new changes, growing lesions, or other lesions of concern Come in for evaluation if you have skin lesions of concern.  Lung cancer screening: If you have a greater than 20 pack year history of tobacco use, then you may qualify for lung cancer screening with a chest CT scan.   Please call your insurance company to inquire about coverage for this test.  We currently don't have screenings for other cancers besides breast, cervical, colon, and lung cancers.  If you have a strong family history of cancer or have other cancer screening concerns, please let me know.    Bone health: Get at least 150 minutes of aerobic exercise weekly Get weight bearing exercise at least once weekly Bone density test:  A bone density test is an imaging test that uses a type of X-ray to measure the amount of calcium and other minerals in your bones. The test may be used to diagnose or screen you for a condition that causes weak or thin bones (osteoporosis), predict your risk for a broken bone (fracture), or determine how well your osteoporosis treatment is working. The bone density test is recommended for females 2 and older, or females or males <29 if certain risk factors such as thyroid disease, long term use of steroids such as for asthma or rheumatological issues, vitamin D deficiency, estrogen deficiency, family history of osteoporosis, self or family history of fragility fracture in first degree relative.    Heart health: Get at least 150 minutes of aerobic exercise weekly Limit alcohol It is important to maintain a healthy blood pressure and healthy cholesterol numbers  Heart disease screening: Screening for heart disease includes screening for blood pressure, fasting lipids, glucose/diabetes screening, BMI height to weight ratio, reviewed of smoking status, physical activity, and diet.    Goals include blood pressure 120/80 or less, maintaining a healthy  lipid/cholesterol profile, preventing diabetes or keeping diabetes numbers under good control, not smoking or using tobacco products, exercising most days per week or at least 150 minutes per week of exercise, and eating healthy variety of fruits and vegetables, healthy oils, and avoiding unhealthy food choices like fried food, fast food, high sugar and high cholesterol foods.    We discussed his heart CT coronary result 06/15/2021 IMPRESSION: Coronary calcium score of 226 is at the 93rd percentile for the patient's age, sex and race.    Medical care options: I recommend you continue to seek care here first for routine care.  We try really hard to have available appointments Monday through Friday daytime hours for sick visits, acute visits, and physicals.  Urgent care should be used for after hours and weekends for significant issues that cannot wait till the next day.  The emergency department should be used for significant potentially life-threatening emergencies.  The emergency department  is expensive, can often have long wait times for less significant concerns, so try to utilize primary care, urgent care, or telemedicine when possible to avoid unnecessary trips to the emergency department.  Virtual visits and telemedicine have been introduced since the pandemic started in 2020, and can be convenient ways to receive medical care.  We offer virtual appointments as well to assist you in a variety of options to seek medical care.   Separate significant issues discussed:  Hypertension-continue current medication  Hyperlipidemia-continue current medication, routine labs today  BPH-no complaint  Impaired glucose-routine labs today  History lymphoma-advised routine follow-up with his dermatologist.  I reviewed recent notes in the chart record regarding this follow-up  History of TIA- continue current therapy   Shontez was seen today for fasting cpe.  Diagnoses and all orders for this  visit:  Encounter for health maintenance examination in adult -     Comprehensive metabolic panel -     Lipid panel -     Hemoglobin A1c -     PSA -     Cancel: POCT Urinalysis DIP (Proadvantage Device) -     CBC with Differential/Platelet  Dyslipidemia -     Lipid panel  BPH without urinary obstruction  Vaccine counseling -     Flu Vaccine QUAD 53moIM (Fluarix, Fluzone & Alfiuria Quad PF)  History of TIA (transient ischemic attack)  History of lymphoma  Family history of abdominal aortic aneurysm (AAA)  Essential hypertension, benign  Impaired fasting blood sugar -     Hemoglobin A1c  Screening for prostate cancer -     PSA  Screen for colon cancer -     Fecal occult blood, imunochemical    Follow-up pending labs, yearly for physical

## 2022-05-28 ENCOUNTER — Encounter: Payer: Self-pay | Admitting: Internal Medicine

## 2022-05-28 ENCOUNTER — Other Ambulatory Visit: Payer: Self-pay | Admitting: Medical

## 2022-05-28 LAB — COMPREHENSIVE METABOLIC PANEL
ALT: 24 IU/L (ref 0–44)
AST: 17 IU/L (ref 0–40)
Albumin/Globulin Ratio: 2 (ref 1.2–2.2)
Albumin: 4.7 g/dL (ref 3.8–4.9)
Alkaline Phosphatase: 68 IU/L (ref 44–121)
BUN/Creatinine Ratio: 10 (ref 10–24)
BUN: 12 mg/dL (ref 8–27)
Bilirubin Total: 0.5 mg/dL (ref 0.0–1.2)
CO2: 21 mmol/L (ref 20–29)
Calcium: 9.5 mg/dL (ref 8.6–10.2)
Chloride: 102 mmol/L (ref 96–106)
Creatinine, Ser: 1.17 mg/dL (ref 0.76–1.27)
Globulin, Total: 2.4 g/dL (ref 1.5–4.5)
Glucose: 104 mg/dL — ABNORMAL HIGH (ref 70–99)
Potassium: 4.5 mmol/L (ref 3.5–5.2)
Sodium: 141 mmol/L (ref 134–144)
Total Protein: 7.1 g/dL (ref 6.0–8.5)
eGFR: 71 mL/min/{1.73_m2} (ref >59)

## 2022-05-28 LAB — CBC WITH DIFFERENTIAL/PLATELET
Basophils Absolute: 0 10*3/uL (ref 0.0–0.2)
Basos: 1 %
EOS (ABSOLUTE): 0.2 10*3/uL (ref 0.0–0.4)
Eos: 4 %
Hematocrit: 48.2 % (ref 37.5–51.0)
Hemoglobin: 16.3 g/dL (ref 13.0–17.7)
Immature Grans (Abs): 0 10*3/uL (ref 0.0–0.1)
Immature Granulocytes: 1 %
Lymphocytes Absolute: 1.5 10*3/uL (ref 0.7–3.1)
Lymphs: 25 %
MCH: 31.2 pg (ref 26.6–33.0)
MCHC: 33.8 g/dL (ref 31.5–35.7)
MCV: 92 fL (ref 79–97)
Monocytes Absolute: 0.6 10*3/uL (ref 0.1–0.9)
Monocytes: 11 %
Neutrophils Absolute: 3.4 10*3/uL (ref 1.4–7.0)
Neutrophils: 58 %
Platelets: 202 10*3/uL (ref 150–450)
RBC: 5.23 x10E6/uL (ref 4.14–5.80)
RDW: 14.5 % (ref 11.6–15.4)
WBC: 5.8 10*3/uL (ref 3.4–10.8)

## 2022-05-28 LAB — PSA: Prostate Specific Ag, Serum: 0.5 ng/mL (ref 0.0–4.0)

## 2022-05-28 LAB — LIPID PANEL
Chol/HDL Ratio: 2.8 ratio (ref 0.0–5.0)
Cholesterol, Total: 144 mg/dL (ref 100–199)
HDL: 51 mg/dL (ref >39)
LDL Chol Calc (NIH): 78 mg/dL (ref 0–99)
Triglycerides: 76 mg/dL (ref 0–149)
VLDL Cholesterol Cal: 15 mg/dL (ref 5–40)

## 2022-05-28 LAB — HEMOGLOBIN A1C
Est. average glucose Bld gHb Est-mCnc: 126 mg/dL
Hgb A1c MFr Bld: 6 % — ABNORMAL HIGH (ref 4.8–5.6)

## 2022-05-28 MED ORDER — ROSUVASTATIN CALCIUM 20 MG PO TABS: 20.0000 mg | ORAL_TABLET | Freq: Every day | ORAL | 3 refills | Status: DC

## 2022-05-28 MED ORDER — ASPIRIN 81 MG PO TBEC: 81.0000 mg | DELAYED_RELEASE_TABLET | Freq: Every day | ORAL | 3 refills | Status: DC

## 2022-05-28 MED ORDER — AMLODIPINE-OLMESARTAN 5-20 MG PO TABS: 1.0000 | ORAL_TABLET | Freq: Every day | ORAL | 3 refills | Status: DC

## 2023-02-28 ENCOUNTER — Ambulatory Visit (HOSPITAL_BASED_OUTPATIENT_CLINIC_OR_DEPARTMENT_OTHER)
Admission: RE | Admit: 2023-02-28 | Discharge: 2023-02-28 | Disposition: A | Discharge status: Home / Self Care | Payer: BC Managed Care – PPO | Source: Ambulatory Visit | Attending: Medical | Admitting: Medical

## 2023-02-28 ENCOUNTER — Ambulatory Visit: Payer: BC Managed Care – PPO | Admitting: Medical

## 2023-02-28 VITALS — BP 130/80 | HR 73 | Temp 97.3°F | Wt 181.0 lb

## 2023-02-28 DIAGNOSIS — M79661 Pain in right lower leg: Secondary | ICD-10-CM | POA: Diagnosis present

## 2023-02-28 DIAGNOSIS — M7989 Other specified soft tissue disorders: Secondary | ICD-10-CM | POA: Insufficient documentation

## 2023-02-28 NOTE — Patient Instructions (Addendum)
Medial Head Gastrocnemius Tear  Medial head gastrocnemius tear, also called tennis leg, is an injury to the inner (medial) part of the calf muscle. This injury may include overstretching of the muscle or a partial or complete tear. This is a common sports injury. Calf muscle tears usually occur near the back of the knee. This often causes sudden pain and muscle weakness. What are the causes? This condition is caused by forceful stretching or strain on the calf muscle. This usually happens when you forcefully push off of your foot. It may also happen if you forcefully straighten your knee while your foot is flat on the ground. What increases the risk? The following factors may make you more likely to develop this condition: Being male and older than age 74. Playing sports that involve: Quick increases in speed and changes of direction, such as in tennis and soccer. Jumping. Running, especially uphill or on uneven ground. What are the signs or symptoms? Symptoms of this condition include: Sudden pain in the back of the leg. You may hear a noise, like a pop or a snap at the time of injury. Pain that gets worse when you bring your toes up or when you straighten your knee. Pain on the inside of your calf, from your knee to your ankle. Pain when pressing on your calf muscle. Swelling and bruising along your calf and lower leg, down to your ankle. This may worsen for the first 2 days before getting better. Not being able to rise up on your toes, or pain when doing so. Difficulty pushing off your foot when walking or using stairs. How is this diagnosed? This condition may be diagnosed based on: Your symptoms and medical history. A physical exam. Your health care provider may be able to feel a lump or a defect in your muscle. An MRI or ultrasound to determine the severity and exact location of your injury. How is this treated? Treatment for this condition may include: Resting the muscle and  keeping weight off your leg for several days. During this time, you may use crutches or another walking device. Using a splint to keep your ankle or knee in a stable position. Wearing a walking boot to decrease the use of your gastrocnemius muscle. Using a wedge under your heel to reduce stretching of your healing muscle. Wearing a compression sleeve around your calf muscle. Taking medicine for pain and swelling, such as NSAIDs or steroids. Taking medicine for muscle spasms. Follow these instructions at home: Medicines Take over-the-counter and prescription medicines only as told by your health care provider. Ask your health care provider if the medicine prescribed to you requires you to avoid driving or using heavy machinery. Talk with your health care provider before you take any medicines that contain aspirin. Aspirin increases your risk for bleeding at the injured area. If you have a removable splint, boot, or compression sleeve: Wear it as told by your health care provider. Remove it only as told by your health care provider. Check the skin around it every day. Tell your health care provider about any concerns. Loosen the splint, boot, or sleeve if your toes tingle, become numb, or turn cold and blue. Keep the splint, boot, or sleeve clean and dry. If the splint, boot, or sleeve is not waterproof: Do not let it get wet. Cover it with a watertight covering when you take a bath or shower. Managing pain, stiffness, and swelling  If directed, put ice on the injured area. If  you have a removable splint, boot, or sleeve, remove it as told by your health care provider. Put ice in a plastic bag. Place a towel between your skin and the bag. Leave the ice on for 20 minutes, 2-3 times a day. Remove the ice if your skin turns bright red. This is very important. If you cannot feel pain, heat, or cold, you have a greater risk of damage to the area. Move your toes often to reduce stiffness and  swelling. Elevate the injured area above the level of your heart while you are sitting or lying down. Activity Return to your normal activities as told by your health care provider. Ask your health care provider what activities are safe for you. Do not use the injured limb to support your body weight until your health care provider says that you can. Use crutches as told by your health care provider. Do leg exercises as told by your health care provider or physical therapist. Return to sporting activity gradually and only as told by your health care provider or physical therapist. Full recovery may take several months. General instructions Ask your health care provider when it is safe to drive. Do not use any products that contain nicotine or tobacco. These products include cigarettes, chewing tobacco, and vaping devices, such as e-cigarettes. If you need help quitting, ask your health care provider. Keep all follow-up visits. This is important. How is this prevented? Warm up and stretch before being active. Cool down and stretch after being active. Give your body time to rest between periods of activity. Maintain physical fitness, including: Strength. Flexibility. Contact a health care provider if: Your symptoms do not improve with rest and treatment. Get help right away if: You have swelling or redness in your calf that is getting worse. Your skin or toenails turn blue or gray, feel cold, or become numb. Summary Medial head gastrocnemius tear, also called tennis leg, is an injury to the inner part of the calf muscle. Follow instructions as told by your health care provider for resting, icing, compressing, and elevating your leg. Take over-the-counter and prescription medicines only as told by your health care provider. Contact a health care provider if your symptoms do not improve with rest and treatment. This information is not intended to replace advice given to you by your health  care provider. Make sure you discuss any questions you have with your health care provider. Document Revised: 01/04/2021 Document Reviewed: 01/04/2021 Elsevier Patient Education  2024 Elsevier Inc.    [employer] Sleeve online prices range from $10-$50

## 2023-02-28 NOTE — Progress Notes (Signed)
Subjective:  KELIJAH MEEK Sr. is a 62 y.o. male who presents for Chief Complaint  Patient presents with   right knee pain    Right knee pain- been trying some of aspecreme and no relief     Here for injury.  About 6 days ago he was getting ready to climb up on his bumper of his truck to get into the back of his truck when he slipped.  He slipped quickly and fell onto the back of his truck.  Later that day started having pain in his right knee and calf.  Since then he has had some ongoing discomfort in the leg mainly in the calf and some swelling of the calf.  But it does hurt in the calf and the need to stand and put weight on the leg.  He has been to work all week despite the injury.  He has used some ibuprofen no other treatment.  No other concerns, no other symptoms.  No fever, no shortness of breath or wheezing.  No numbness tingling or weakness.  No other aggravating or relieving factors.    No other c/o.  The following portions of the patient's history were reviewed and updated as appropriate: allergies, current medications, past family history, past medical history, past social history, past surgical history and problem list.  ROS Otherwise as in subjective above  Objective: BP 130/80   Pulse 73   Temp (!) 97.3 F (36.3 C)   Wt 181 lb (82.1 kg)   BMI 29.21 kg/m   General appearance: alert, no distress, well developed, well nourished There seems to be some asymmetry of the right lower leg calf region compared to the left and some definite swelling of the right lower leg in the calf region.  No obvious bruising, no discoloration, no palpable cord.  With range of motion there is some subtle differences with the musculature of the right calf compared to left but rest of leg unremarkable.  Right knee nontender with no swelling or laxity.  No pain with range of motion. There is some pain with resisted right dorsiflexion in the calf medially. Leg is neurovascularly  intact    Assessment: Encounter Diagnoses  Name Primary?   Right calf pain Yes   Leg swelling      Plan: We discussed symptoms, exam findings, mechanism of injury.  I suspect he has a mild partial tear of the gastrocnemius medially of the right leg.  Cannot completely rule out DVT though.  I think he is low risk for DVT.  We discussed acute treatment which is RICE, rest, ice, compression with calf sleeve, elevation and can use some ibuprofen over the next week.  If [place] neg for DVT, then continue initial RICE and call report in 1 week.    Will likely refer to PT   Azel was seen today for right knee pain.  Diagnoses and all orders for this visit:  Right calf pain -     US Venous Img Lower Unilateral Left (DVT); Future  Leg swelling -     US Venous Img Lower Unilateral Left (DVT); Future    Follow up: Pending test results

## 2023-03-03 ENCOUNTER — Ambulatory Visit: Payer: BC Managed Care – PPO | Admitting: Medical

## 2023-03-04 NOTE — Progress Notes (Signed)
Call and see how he is doing with rest and ice and anti-inflammatory since Friday?  Please go ahead and put in a referral to physical therapy to help him get back to where he needs to be

## 2023-06-02 ENCOUNTER — Ambulatory Visit: Payer: BC Managed Care – PPO | Admitting: Medical

## 2023-06-02 ENCOUNTER — Encounter: Payer: Self-pay | Admitting: Medical

## 2023-06-02 VITALS — BP 120/70 | HR 92 | Ht 67.0 in | Wt 180.2 lb

## 2023-06-02 DIAGNOSIS — E785 Hyperlipidemia, unspecified: Secondary | ICD-10-CM

## 2023-06-02 DIAGNOSIS — Z8572 Personal history of non-Hodgkin lymphomas: Secondary | ICD-10-CM

## 2023-06-02 DIAGNOSIS — Z0000 Encounter for general adult medical examination without abnormal findings: Secondary | ICD-10-CM | POA: Diagnosis not present

## 2023-06-02 DIAGNOSIS — G8929 Other chronic pain: Secondary | ICD-10-CM

## 2023-06-02 DIAGNOSIS — R7301 Impaired fasting glucose: Secondary | ICD-10-CM

## 2023-06-02 DIAGNOSIS — Z125 Encounter for screening for malignant neoplasm of prostate: Secondary | ICD-10-CM

## 2023-06-02 DIAGNOSIS — C8415 Sezary disease, lymph nodes of inguinal region and lower limb: Secondary | ICD-10-CM

## 2023-06-02 DIAGNOSIS — Z7185 Encounter for immunization safety counseling: Secondary | ICD-10-CM | POA: Diagnosis not present

## 2023-06-02 DIAGNOSIS — I10 Essential (primary) hypertension: Secondary | ICD-10-CM

## 2023-06-02 DIAGNOSIS — M25561 Pain in right knee: Secondary | ICD-10-CM

## 2023-06-02 DIAGNOSIS — Z23 Encounter for immunization: Secondary | ICD-10-CM

## 2023-06-02 DIAGNOSIS — N400 Enlarged prostate without lower urinary tract symptoms: Secondary | ICD-10-CM

## 2023-06-02 DIAGNOSIS — Z8673 Personal history of transient ischemic attack (TIA), and cerebral infarction without residual deficits: Secondary | ICD-10-CM | POA: Diagnosis not present

## 2023-06-02 LAB — POCT URINALYSIS DIP (PROADVANTAGE DEVICE)
Bilirubin, UA: NEGATIVE
Blood, UA: NEGATIVE
Glucose, UA: NEGATIVE mg/dL
Ketones, POC UA: NEGATIVE mg/dL
Nitrite, UA: NEGATIVE
Protein Ur, POC: NEGATIVE mg/dL
Specific Gravity, Urine: 1.015
Urobilinogen, Ur: NEGATIVE
pH, UA: 6 (ref 5.0–8.0)

## 2023-06-02 MED ORDER — VALCHLOR 0.016 % EX GEL: 1.0000 | Freq: Every day | CUTANEOUS | 0 refills | Status: DC

## 2023-06-02 NOTE — Progress Notes (Signed)
Subjective:   HPI  Hayden ALOISI Sr. is a 62 y.o. male who presents for Chief Complaint  Patient presents with   Annual Exam    Fasting cpe, no concerns, would like flu shot today    Patient Care Team: Almer Littleton, Cleda Mccreedy as PCP - General (Family Medicine) Healthcare Team: Neurology: Dr. Renae Fickle Gastro: Dr. Yancey Flemings Dermatology: Dr. Sharman Crate Dentist and eye doctor   Concerns: Hypertension-compliant with medication without complaint.  No chest pain, no palpitation, no edema  Hyperlipidemia-compliant with medication without complaint  When urinating there is a noticeable odor of late.   No burning, no blood in urine.  Use to have problems with allergies seasonally but since having covid, doesn't seem to have allergy problem for now.  Right knee bothers him every now and then.  No swelling.  Worse if on knees doing activity  Exercise - walking.  Working full time.   Hasn't seen dermatology in a while.     Reviewed their medical, surgical, family, social, medication, and allergy history and updated chart as appropriate.  Past Medical History:  Diagnosis Date   Abnormal MRA, brain 02/23/13   3mm small superior hypophoseal infundibulum (favored) vs aneurysm   Allergic rhinitis    Allergy    Dyslipidemia    H/O diagnostic ultrasound 8/14   carotid, normal   H/O echocardiogram 7/14   TTE, normal LV function, concentric LV moderate hypertrophy, 65-70% EF   Heart murmur    History of MRI of brain and brain stem 02/23/13   Normal noncontrast MRI appearance of the brain. 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 Yancey Flemings   WISDOM TOOTH EXTRACTION     with sedation    Family History  Problem Relation Age of Onset   Diabetes Father    Hypertension Father    Stroke Father    AAA (abdominal aortic aneurysm) Father    Diabetes Brother    Hyperlipidemia Mother    Hypertension Mother    Other Brother        brain injury s/p MVA   High blood pressure Other    Prostate cancer Paternal Grandfather    Cancer Maternal Grandmother        liver   Colon cancer Neg Hx    Rectal cancer Neg Hx    Stomach cancer Neg Hx    Esophageal cancer Neg Hx    Heart disease Neg Hx      Current Outpatient Medications:    amLODipine-olmesartan (AZOR) 5-20 MG tablet, Take 1 tablet by mouth daily., Disp: 90 tablet, Rfl: 3   aspirin EC 81 MG tablet, Take 1 tablet (81 mg total) by mouth daily. Swallow whole., Disp: 90 tablet, Rfl: 3   rosuvastatin (CRESTOR) 20 MG tablet, Take 1 tablet (20 mg total) by mouth at bedtime., Disp: 90 tablet, Rfl: 3   Mechlorethamine HCl (VALCHLOR) 0.016 % GEL, Apply 1 Application topically daily., Disp: 60 g, Rfl: 0  No Known Allergies   Review of Systems  Constitutional:  Negative for chills, fever, malaise/fatigue and weight loss.  HENT:  Negative for congestion, ear pain, hearing loss, sore throat and tinnitus.   Eyes:  Negative for blurred vision, pain and redness.  Respiratory:  Negative for cough, hemoptysis and shortness of breath.   Cardiovascular:  Negative for chest pain, palpitations, orthopnea, claudication and leg swelling.  Gastrointestinal:  Negative for abdominal pain, blood in stool, constipation, diarrhea, nausea and vomiting.  Genitourinary:  Negative for dysuria, flank pain, frequency, hematuria and urgency.       Urine odor  Musculoskeletal:  Positive for joint pain. Negative for falls and myalgias.  Skin:  Negative for itching and rash.  Neurological:  Negative for dizziness, tingling, speech change, weakness and headaches.  Endo/Heme/Allergies:  Negative for  polydipsia. Does not bruise/bleed easily.  Psychiatric/Behavioral:  Negative for depression and memory loss. The patient is not nervous/anxious and does not have insomnia.         06/02/2023    8:19 AM 05/27/2022    8:22 AM 05/25/2021    8:29 AM 05/24/2020    9:19 AM 05/24/2019    9:50 AM  Depression screen PHQ 2/9  Decreased Interest 0 0 0 0 0  Down, Depressed, Hopeless 0 0 0 0 0  PHQ - 2 Score 0 0 0 0 0        Objective:  BP 120/70   Pulse 92   Ht 5\' 7"  (1.702 m)   Wt 180 lb 3.2 oz (81.7 kg)   BMI 28.22 kg/m   Wt Readings from Last 3 Encounters:  06/02/23 180 lb 3.2 oz (81.7 kg)  02/28/23 181 lb (82.1 kg)  05/27/22 181 lb 12.8 oz (82.5 kg)    General appearance: alert, no distress, WD/WN, African American male Skin: Right mid lateral thigh with large roughly 6 cm x 5 cm patch of left pinkish skin, other patches of hypopigmented scarring flat of right distal thigh laterally, left buttock with approximately 4 cm diameter patch of slightly hypopigmented skin with pattern similar to those on the right thigh, otherwise no other worrisome lesions HEENT: normocephalic, conjunctiva/corneas normal, sclerae anicteric, PERRLA, EOMi Neck: supple, palpable submandibular nodes, but no specific lymphadenopathy, no thyromegaly, no masses, normal ROM, no bruits Chest: non tender, normal shape and expansion Heart: RRR, normal S1, S2, no murmurs Lungs: CTA bilaterally, no wheezes, rhonchi, or rales Abdomen: +bs, soft, non tender, non distended, no masses, no hepatomegaly, no splenomegaly, no bruits Back: non tender, normal ROM, no scoliosis Musculoskeletal: upper extremities non tender, no obvious deformity, normal ROM throughout, lower extremities non tender, no obvious deformity, normal ROM throughout Extremities: no edema, no cyanosis, no clubbing Pulses: 2+ symmetric, upper and lower extremities, normal cap refill Neurological: alert, oriented x 3, CN2-12 intact, strength normal upper  extremities and lower extremities, sensation normal throughout, DTRs 2+ throughout, no cerebellar signs, gait normal Psychiatric: normal affect, behavior normal, pleasant  GU: normal male external genitalia,circumcised, nontender, no masses, no hernia, no lymphadenopathy Rectal: Anus normal tone, prostate moderately enlarged, no nodules    Assessment and Plan :   Encounter Diagnoses  Name Primary?   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   Topics typically include healthy lifestyle, diet, exercise, preventative care, vaccinations, sick and well care, proper use of emergency dept and after hours care, as well as other concerns.     Recommendations: Continue to return yearly for your annual wellness and preventative care visits.  This gives [place] a chance to discuss healthy lifestyle, exercise, vaccinations, review your chart record, and perform screenings where appropriate.  I recommend you see your eye doctor yearly for routine vision care.  I recommend you see your dentist yearly for routine dental care including hygiene visits twice yearly.   Vaccination recommendations were reviewed Immunization History  Administered Date(s) Administered   Influenza Inj Mdck Quad Pf 05/11/2019   Influenza,inj,Quad PF,6+ Mos 05/24/2020, 05/25/2021, 05/27/2022   Influenza-Unspecified 05/11/2019   PFIZER(Purple Top)SARS-COV-2 Vaccination 03/21/2020, 04/11/2020   Pneumococcal Polysaccharide-23 05/24/2019   Tdap 09/22/2015    Vaccine  recommendations: Yearly flu shot Covid booster Shingrix   Vaccines administered: Counseled on the influenza virus vaccine.  Vaccine information sheet given.  Influenza vaccine given after consent obtained.    Screening for cancer: Colon cancer screening: I reviewed your colonoscopy on file that is up to date  He is expecting to have a free colon stool screen through employer this week. He will get [place] a copy of the screening.  We discussed PSA, prostate exam, and prostate cancer screening risks/benefits.     Skin cancer screening: Check your skin regularly for new changes, growing lesions, or other lesions of concern Come in for evaluation if you have skin lesions of concern.  Lung cancer screening: If you have a greater than 20 pack year history of tobacco use, then you may qualify for lung cancer screening with a chest CT scan.   Please call your insurance company to inquire about coverage for this test.  We currently don't have screenings for other cancers besides breast, cervical, colon, and lung cancers.  If you have a strong family history of cancer or have other cancer screening concerns, please let me know.    Bone health: Get at least 150 minutes of aerobic exercise weekly Get weight bearing exercise at least once weekly Bone density test:  A bone density test is an imaging test that uses a type of X-ray to measure the amount of calcium and other minerals in your bones. The test may be used to diagnose or screen you for a condition that causes weak or thin bones (osteoporosis), predict your risk for a broken bone (fracture), or determine how well your osteoporosis treatment is working. The bone density test is recommended for females 65 and older, or females or males <65 if certain risk factors such as thyroid disease, long term use of steroids such as for asthma or rheumatological issues, vitamin D deficiency, estrogen deficiency, family history of osteoporosis, self or  family history of fragility fracture in first degree relative.    Heart health: Get at least 150 minutes of aerobic exercise weekly Limit alcohol It is important to maintain a healthy blood pressure and healthy cholesterol numbers  Heart disease screening: Screening for heart disease includes screening for blood pressure, fasting lipids, glucose/diabetes screening, BMI height to weight ratio, reviewed of smoking status, physical activity, and diet.    Goals include blood pressure 120/80 or less, maintaining a healthy lipid/cholesterol profile, preventing diabetes or keeping diabetes numbers under good control, not smoking  or using tobacco products, exercising most days per week or at least 150 minutes per week of exercise, and eating healthy variety of fruits and vegetables, healthy oils, and avoiding unhealthy food choices like fried food, fast food, high sugar and high cholesterol foods.    CT coronary result 06/15/2021 IMPRESSION: Coronary calcium score of 226 is at the 93rd percentile for the patient's age, sex and race.    Medical care options: I recommend you continue to seek care here first for routine care.  We try really hard to have available appointments Monday through Friday daytime hours for sick visits, acute visits, and physicals.  Urgent care should be used for after hours and weekends for significant issues that cannot wait till the next day.  The emergency department should be used for significant potentially life-threatening emergencies.  The emergency department is expensive, can often have long wait times for less significant concerns, so try to utilize primary care, urgent care, or telemedicine when possible to avoid unnecessary trips to the emergency department.  Virtual visits and telemedicine have been introduced since the pandemic started in 2020, and can be convenient ways to receive medical care.  We offer virtual appointments as well to assist you in a variety of  options to seek medical care.   Separate significant issues discussed:  Hypertension-continue current medication  Hyperlipidemia-continue current medication, routine labs today  BPH-no complaint  Impaired glucose-routine labs today  History lymphoma-advised routine follow-up with his dermatologist.  I reviewed recent notes in the chart record regarding this follow-up.  Restart treatment  History of TIA- continue current therapy  Right knee pain Please go to [facility] for your right knee xray.   Their hours are 8am - 4:30 pm Monday - Friday.  Take your insurance card with you.  [facility] [phone] [address] [place], [place] [address]   Markas was seen today for annual exam.  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    Follow-up pending labs, yearly for physical

## 2023-06-03 ENCOUNTER — Other Ambulatory Visit: Payer: Self-pay | Admitting: Medical

## 2023-06-03 LAB — CBC WITH DIFFERENTIAL/PLATELET
Basophils Absolute: 0 10*3/uL (ref 0.0–0.2)
Basos: 1 %
EOS (ABSOLUTE): 0.2 10*3/uL (ref 0.0–0.4)
Eos: 3 %
Hematocrit: 45.5 % (ref 37.5–51.0)
Hemoglobin: 15.9 g/dL (ref 13.0–17.7)
Immature Grans (Abs): 0 10*3/uL (ref 0.0–0.1)
Immature Granulocytes: 0 %
Lymphocytes Absolute: 1.5 10*3/uL (ref 0.7–3.1)
Lymphs: 30 %
MCH: 32.5 pg (ref 26.6–33.0)
MCHC: 34.9 g/dL (ref 31.5–35.7)
MCV: 93 fL (ref 79–97)
Monocytes Absolute: 0.6 10*3/uL (ref 0.1–0.9)
Monocytes: 11 %
Neutrophils Absolute: 2.7 10*3/uL (ref 1.4–7.0)
Neutrophils: 55 %
Platelets: 206 10*3/uL (ref 150–450)
RBC: 4.89 x10E6/uL (ref 4.14–5.80)
RDW: 13.1 % (ref 11.6–15.4)
WBC: 5 10*3/uL (ref 3.4–10.8)

## 2023-06-03 LAB — COMPREHENSIVE METABOLIC PANEL
ALT: 12 [IU]/L (ref 0–44)
AST: 17 [IU]/L (ref 0–40)
Albumin: 4.6 g/dL (ref 3.9–4.9)
Alkaline Phosphatase: 65 [IU]/L (ref 44–121)
BUN/Creatinine Ratio: 10 (ref 10–24)
BUN: 11 mg/dL (ref 8–27)
Bilirubin Total: 0.4 mg/dL (ref 0.0–1.2)
CO2: 21 mmol/L (ref 20–29)
Calcium: 9.3 mg/dL (ref 8.6–10.2)
Chloride: 107 mmol/L — ABNORMAL HIGH (ref 96–106)
Creatinine, Ser: 1.07 mg/dL (ref 0.76–1.27)
Globulin, Total: 2.4 g/dL (ref 1.5–4.5)
Glucose: 102 mg/dL — ABNORMAL HIGH (ref 70–99)
Potassium: 4.1 mmol/L (ref 3.5–5.2)
Sodium: 142 mmol/L (ref 134–144)
Total Protein: 7 g/dL (ref 6.0–8.5)
eGFR: 79 mL/min/{1.73_m2} (ref >59)

## 2023-06-03 LAB — LIPID PANEL
Chol/HDL Ratio: 3.7 {ratio} (ref 0.0–5.0)
Cholesterol, Total: 185 mg/dL (ref 100–199)
HDL: 50 mg/dL (ref >39)
LDL Chol Calc (NIH): 122 mg/dL — ABNORMAL HIGH (ref 0–99)
Triglycerides: 67 mg/dL (ref 0–149)
VLDL Cholesterol Cal: 13 mg/dL (ref 5–40)

## 2023-06-03 LAB — HEPATITIS C ANTIBODY

## 2023-06-03 LAB — HIV ANTIBODY (ROUTINE TESTING W REFLEX)

## 2023-06-03 LAB — LACTATE DEHYDROGENASE: LDH: 136 IU/L (ref 121–224)

## 2023-06-03 LAB — HEMOGLOBIN A1C
Est. average glucose Bld gHb Est-mCnc: 126 mg/dL
Hgb A1c MFr Bld: 6 % — ABNORMAL HIGH (ref 4.8–5.6)

## 2023-06-03 LAB — PSA: Prostate Specific Ag, Serum: 0.9 ng/mL (ref 0.0–4.0)

## 2023-06-03 MED ORDER — ROSUVASTATIN CALCIUM 20 MG PO TABS: 20.0000 mg | ORAL_TABLET | Freq: Every day | ORAL | 3 refills | Status: DC

## 2023-06-03 MED ORDER — AMLODIPINE-OLMESARTAN 5-20 MG PO TABS: 1.0000 | ORAL_TABLET | Freq: Every day | ORAL | 3 refills | Status: DC

## 2023-06-03 MED ORDER — ASPIRIN 81 MG PO TBEC: 81.0000 mg | DELAYED_RELEASE_TABLET | Freq: Every day | ORAL | 3 refills | Status: DC

## 2023-06-03 NOTE — Progress Notes (Signed)
Results sent through MyChart

## 2023-06-03 NOTE — Progress Notes (Signed)
Results sent through MyChart

## 2023-06-04 ENCOUNTER — Telehealth: Payer: Self-pay | Admitting: Medical

## 2023-06-04 NOTE — Telephone Encounter (Signed)
Pt was notified.  

## 2023-06-04 NOTE — Telephone Encounter (Signed)
I tried to write a prescription for the Valchlor cream but it must be restricted to dermatology use as the prescription cannot be processed  Please let him know to follow-up with dermatology

## 2023-09-10 IMAGING — CT CT CARDIAC CORONARY ARTERY CALCIUM SCORE
3 series · 14 of 20 positions shown, 16 images · non-contrast
Comparison: None.

CLINICAL DATA: 60-year-old African American male with history of
hyperlipidemia and hypertension.

EXAM:
CT CARDIAC CORONARY ARTERY CALCIUM SCORE
TECHNIQUE: Non-contrast imaging through the heart was performed using
prospective ECG gating. Image post processing was performed on an
independent workstation, allowing for quantitative analysis of the
heart and coronary arteries. Note that this exam targets the heart
and the chest was not imaged in its entirety.

[Series 2: calcium scoring 2.00 qr36 bestdiast 70% hrt calciu · axial · 0.40mm/px · z∈[+1613,+1697]mm · 4 of 70 slices shown]
[im 14/70  vessel]
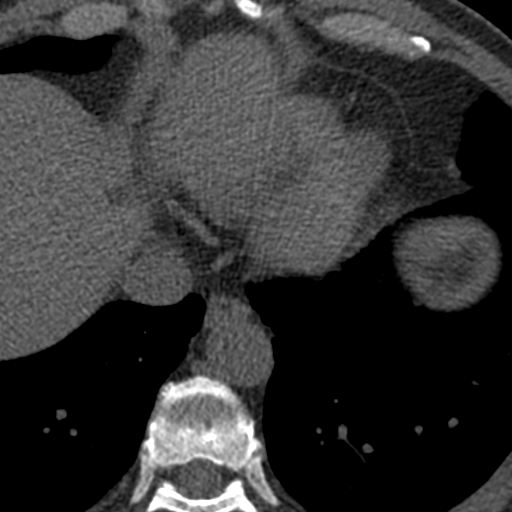
[im 28/70  vessel]
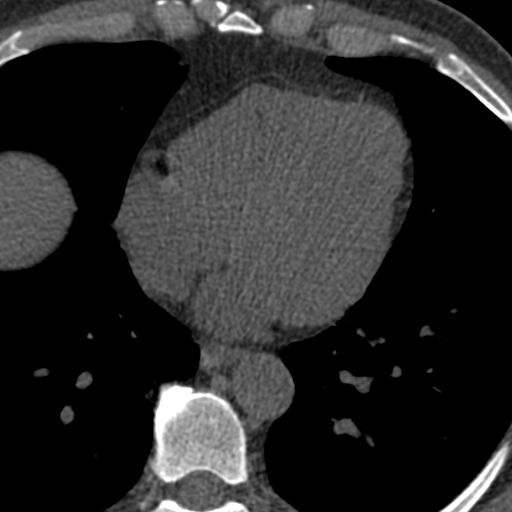
[im 42/70  vessel]
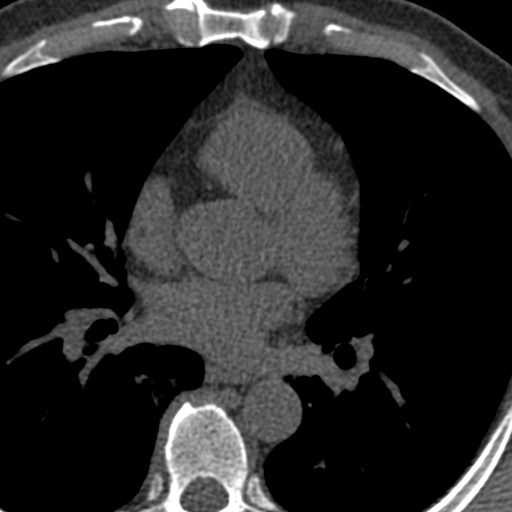
[im 56/70  vessel]
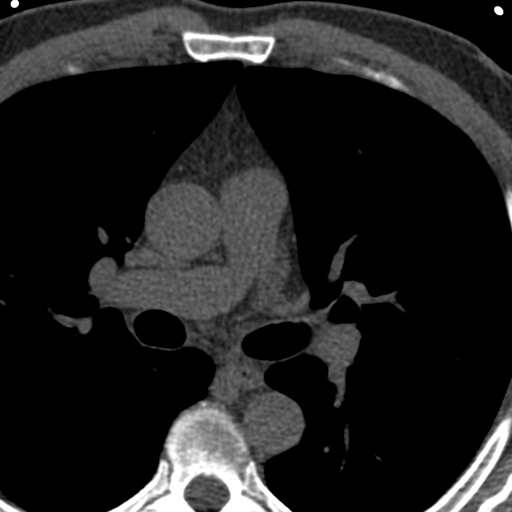

[Series 3: calcium scoring 2.00 br40 bestdiast 70% axial · axial · 0.59mm/px · z∈[+1609,+1701]mm · 5 of 70 slices shown, 7 images]
[im 12/70  vessel]
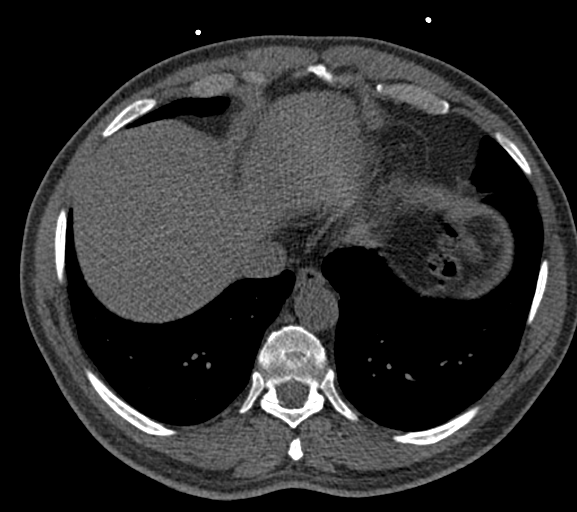
[im 12/70  lung]
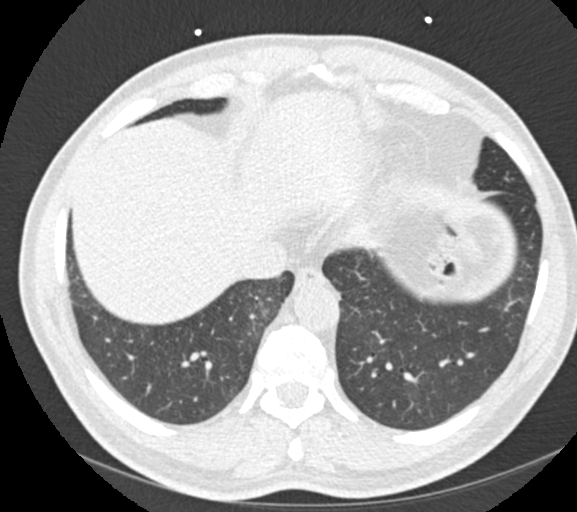
[im 24/70  vessel]
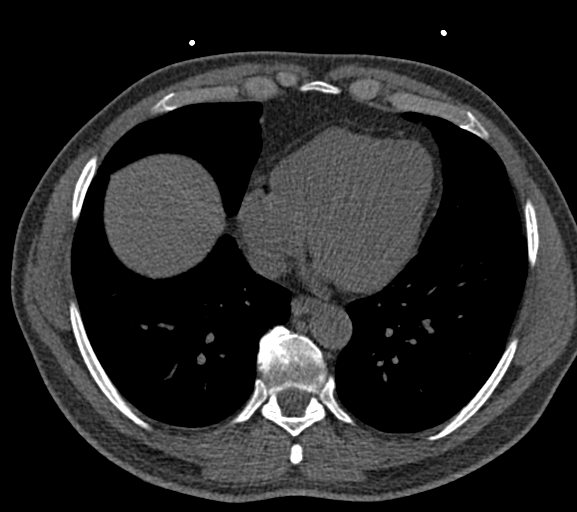
[im 35/70  vessel]
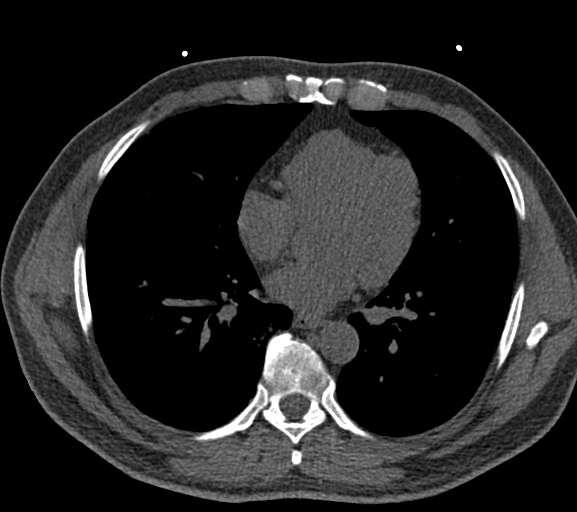
[im 47/70  vessel]
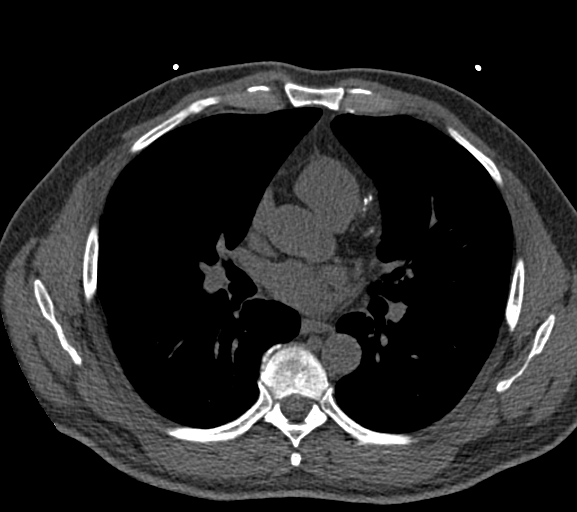
[im 58/70  vessel]
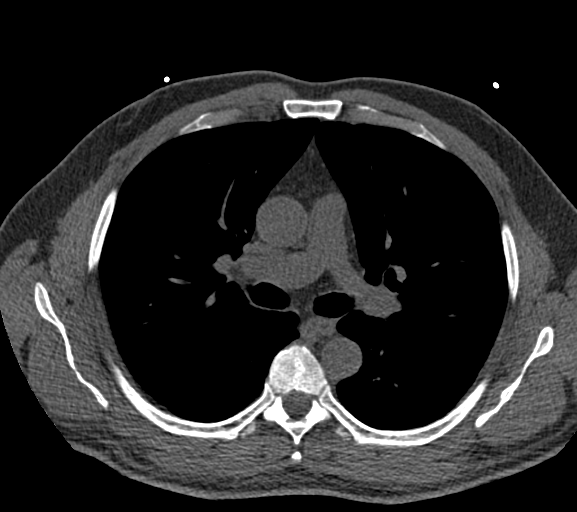
[im 58/70  lung]
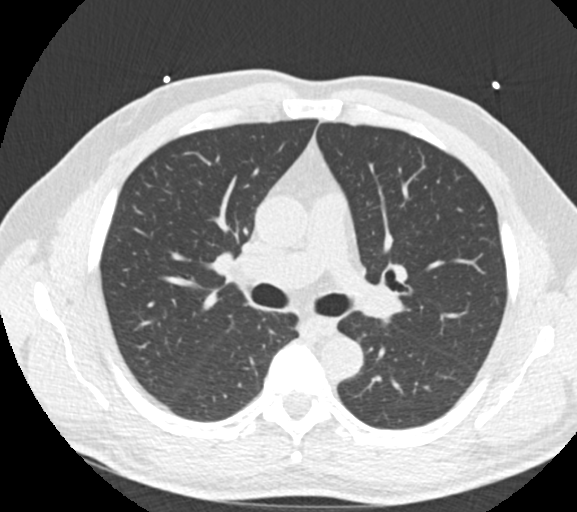

[Series 9: calcium scoring 2.00 br60 bestdiast 70% lungs · axial · 0.57mm/px · z∈[+1609,+1701]mm · 5 of 70 slices shown]
[im 12/70  vessel]
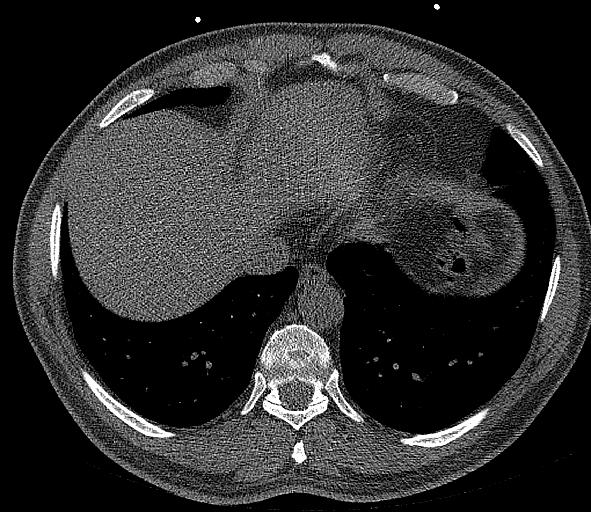
[im 24/70  vessel]
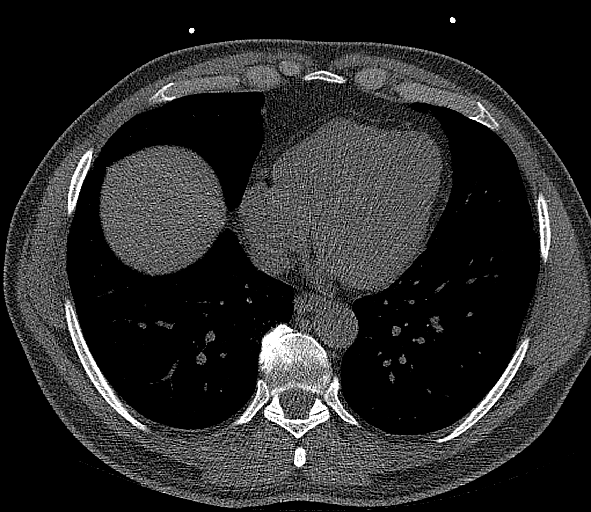
[im 35/70  vessel]
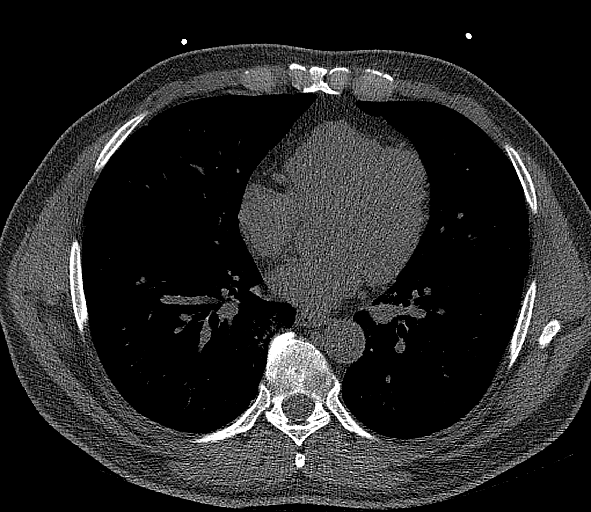
[im 47/70  vessel]
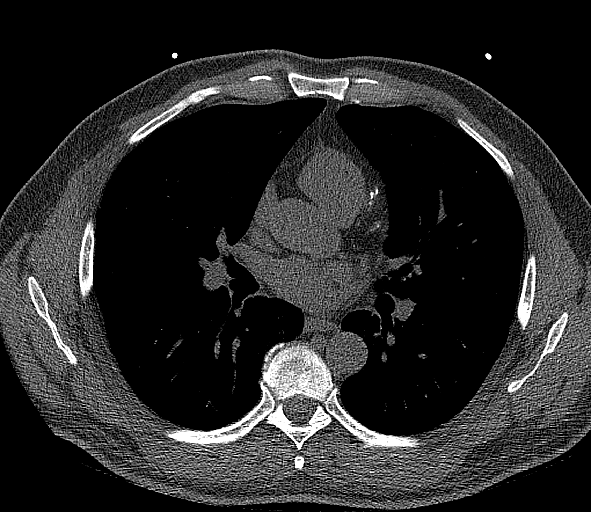
[im 58/70  vessel]
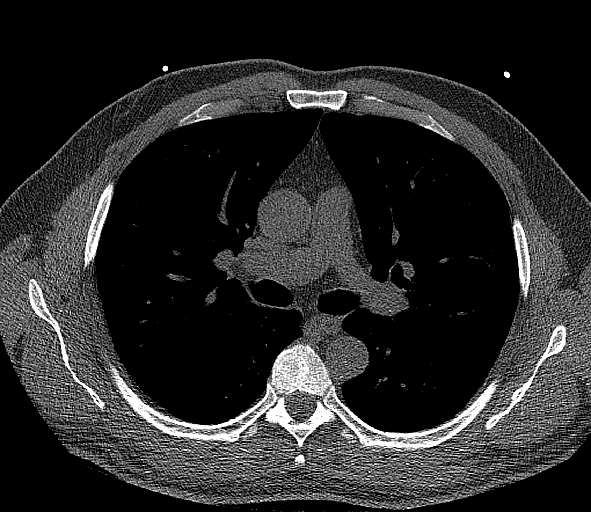

[14 of 20 positions shown; findings below may reference images not displayed]

FINDINGS: CORONARY CALCIUM SCORES:

Left Main:

LAD: 146

LCx:

RCA:

Total Agatston Score: 226

[HOSPITAL] percentile: 93

AORTA MEASUREMENTS:

Ascending Aorta: 29 mm

Descending Aorta: 24 mm

OTHER FINDINGS:

The heart size is within normal limits. No pericardial fluid is
identified. Visualized segments of the thoracic aorta and central
pulmonary arteries are normal in caliber. Visualized mediastinum and
hilar regions demonstrate no lymphadenopathy or masses. Pulmonary
scarring in both lungs. Visualized lungs show no evidence of
pulmonary edema, consolidation, pneumothorax, nodule or pleural
fluid. Visualized upper abdomen and bony structures are
unremarkable.
IMPRESSION: Coronary calcium score of 226 is at the 93rd percentile for the
patient's age, sex and race.

## 2024-06-02 ENCOUNTER — Ambulatory Visit: Payer: BC Managed Care – PPO | Admitting: Medical

## 2024-06-02 VITALS — BP 110/70 | HR 78 | Ht 67.0 in | Wt 178.2 lb

## 2024-06-02 DIAGNOSIS — Z7185 Encounter for immunization safety counseling: Secondary | ICD-10-CM

## 2024-06-02 DIAGNOSIS — Z8572 Personal history of non-Hodgkin lymphomas: Secondary | ICD-10-CM

## 2024-06-02 DIAGNOSIS — Z0000 Encounter for general adult medical examination without abnormal findings: Secondary | ICD-10-CM | POA: Diagnosis not present

## 2024-06-02 DIAGNOSIS — C8415 Sezary disease, lymph nodes of inguinal region and lower limb: Secondary | ICD-10-CM | POA: Diagnosis not present

## 2024-06-02 DIAGNOSIS — E785 Hyperlipidemia, unspecified: Secondary | ICD-10-CM | POA: Diagnosis not present

## 2024-06-02 DIAGNOSIS — I10 Essential (primary) hypertension: Secondary | ICD-10-CM

## 2024-06-02 DIAGNOSIS — R7301 Impaired fasting glucose: Secondary | ICD-10-CM

## 2024-06-02 DIAGNOSIS — Z8673 Personal history of transient ischemic attack (TIA), and cerebral infarction without residual deficits: Secondary | ICD-10-CM

## 2024-06-02 DIAGNOSIS — Z23 Encounter for immunization: Secondary | ICD-10-CM

## 2024-06-02 DIAGNOSIS — C8405 Mycosis fungoides, lymph nodes of inguinal region and lower limb: Secondary | ICD-10-CM

## 2024-06-02 DIAGNOSIS — N400 Enlarged prostate without lower urinary tract symptoms: Secondary | ICD-10-CM

## 2024-06-02 DIAGNOSIS — J309 Allergic rhinitis, unspecified: Secondary | ICD-10-CM

## 2024-06-02 NOTE — Progress Notes (Signed)
 Subjective:   HPI  Hayden CHOPIN Sr. is a 63 y.o. male who presents for Chief Complaint  Patient presents with   Annual Exam    Fasting cpe, flu shot today,no concerns    Patient Care Team: Gearl Kimbrough, Alm GORMAN RIGGERS as PCP - General (Family Medicine) Healthcare Team: Neurology: Dr. Jacquline Garner Gastro: Dr. Norleen Kiang Dermatology: Dr. Kelly Comer Dentist and eye doctor   Concerns: Hypertension-compliant with medication without complaint.  No chest pain, no palpitation, no edema  Hyperlipidemia-compliant with medication without complaint  Reviewed their medical, surgical, family, social, medication, and allergy history and updated chart as appropriate.  Past Medical History:  Diagnosis Date   Abnormal MRA, brain 02/23/13   3mm small superior hypophoseal infundibulum (favored) vs aneurysm   Allergic rhinitis    Allergy    Dyslipidemia    H/O diagnostic ultrasound 8/14   carotid, normal   H/O echocardiogram 7/14   TTE, normal LV function, concentric LV moderate hypertrophy, 65-70% EF   Heart murmur    History of MRI of brain and brain stem 02/23/13   Normal noncontrast MRI appearance of the brain. 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 Norleen Kiang   WISDOM TOOTH EXTRACTION     with sedation    Family History  Problem Relation Age of Onset   Diabetes Father    Hypertension Father    Stroke Father    AAA (abdominal aortic aneurysm) Father    Diabetes Brother    Hyperlipidemia Mother    Hypertension Mother    Other Brother        brain injury s/p MVA   High blood pressure Other    Prostate cancer Paternal Grandfather     Cancer Maternal Grandmother        liver   Colon cancer Neg Hx    Rectal cancer Neg Hx    Stomach cancer Neg Hx    Esophageal cancer Neg Hx    Heart disease Neg Hx      Current Outpatient Medications:    amLODipine -olmesartan  (AZOR ) 5-20 MG tablet, Take 1 tablet by mouth daily., Disp: 90 tablet, Rfl: 3   aspirin  EC 81 MG tablet, Take 1 tablet (81 mg total) by mouth daily. Swallow whole., Disp: 90 tablet, Rfl: 3   rosuvastatin  (CRESTOR ) 20 MG tablet, Take 1 tablet (20 mg total) by mouth at bedtime., Disp: 90 tablet, Rfl: 3   Mechlorethamine  HCl (VALCHLOR ) 0.016 % GEL, Apply 1 Application topically daily. (Patient not taking: Reported on 06/02/2024), Disp: 60 g, Rfl: 0  No Known Allergies   Review of Systems  Constitutional:  Negative for chills, fever, malaise/fatigue and weight loss.  HENT:  Negative for congestion, ear pain, hearing loss, sore throat and tinnitus.   Eyes:  Negative for blurred vision, pain and redness.  Respiratory:  Negative for cough, hemoptysis and shortness of breath.   Cardiovascular:  Negative for chest pain, palpitations, orthopnea, claudication and leg swelling.  Gastrointestinal:  Negative for abdominal pain, blood in stool, constipation, diarrhea, nausea and vomiting.  Genitourinary:  Negative for dysuria, flank pain, frequency, hematuria and  urgency.  Musculoskeletal:  Negative for falls, joint pain and myalgias.  Skin:  Negative for itching and rash.  Neurological:  Negative for dizziness, tingling, speech change, weakness and headaches.  Endo/Heme/Allergies:  Negative for polydipsia. Does not bruise/bleed easily.  Psychiatric/Behavioral:  Negative for depression and memory loss. The patient is not nervous/anxious and does not have insomnia.         06/02/2024    8:14 AM 06/02/2023    8:19 AM 05/27/2022    8:22 AM 05/25/2021    8:29 AM 05/24/2020    9:19 AM  Depression screen PHQ 2/9  Decreased Interest 0 0 0 0 0  Down, Depressed, Hopeless 0 0 0  0 0  PHQ - 2 Score 0 0 0 0 0        Objective:  BP 110/70   Pulse 78   Ht 5' 7 (1.702 m)   Wt 178 lb 3.2 oz (80.8 kg)   BMI 27.91 kg/m   Wt Readings from Last 3 Encounters:  06/02/24 178 lb 3.2 oz (80.8 kg)  06/02/23 180 lb 3.2 oz (81.7 kg)  02/28/23 181 lb (82.1 kg)    General appearance: alert, no distress, WD/WN, African American male Skin: no new worrisome lesions HEENT: normocephalic, conjunctiva/corneas normal, sclerae anicteric, PERRLA, EOMi Neck: supple, palpable submandibular nodes, but no specific lymphadenopathy, no thyromegaly, no masses, normal ROM, no bruits Chest: non tender, normal shape and expansion Heart: RRR, normal S1, S2, no murmurs Lungs: CTA bilaterally, no wheezes, rhonchi, or rales Abdomen: +bs, soft, non tender, non distended, no masses, no hepatomegaly, no splenomegaly, no bruits Back: non tender, normal ROM, no scoliosis Musculoskeletal: upper extremities non tender, no obvious deformity, normal ROM throughout, lower extremities non tender, no obvious deformity, normal ROM throughout Extremities: no edema, no cyanosis, no clubbing Pulses: 2+ symmetric, upper and lower extremities, normal cap refill Neurological: alert, oriented x 3, CN2-12 intact, strength normal upper extremities and lower extremities, sensation normal throughout, DTRs 2+ throughout, no cerebellar signs, gait normal Psychiatric: normal affect, behavior normal, pleasant  GU/rectal - deferred    Assessment and Plan :   Encounter Diagnoses  Name Primary?   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   Topics typically include healthy lifestyle, diet, exercise, preventative care, vaccinations, sick and well care, proper use of emergency dept and after hours care, as well as other concerns.     Recommendations: Continue to return yearly for your annual wellness and preventative care visits.  This gives us  a chance to discuss healthy lifestyle, exercise, vaccinations, review your chart record, and perform screenings where appropriate.  I recommend you see your eye doctor yearly for routine vision care.  I recommend you see your dentist yearly for routine dental care including hygiene visits twice yearly.   Vaccination recommendations were reviewed Immunization History  Administered  Date(s) Administered   Influenza Inj Mdck Quad Pf 05/11/2019   Influenza, Seasonal, Injecte, Preservative Fre 06/02/2023, 06/02/2024   Influenza,inj,Quad PF,6+ Mos 05/24/2020, 05/25/2021, 05/27/2022   Influenza-Unspecified 05/11/2019   PFIZER(Purple Top)SARS-COV-2 Vaccination 03/21/2020, 04/11/2020   PNEUMOCOCCAL CONJUGATE-20 06/02/2024   Pneumococcal Polysaccharide-23 05/24/2019   Tdap 09/22/2015    Vaccine recommendations: Yearly flu shot Covid booster Shingrix   Vaccines administered: Counseled on the influenza virus vaccine.  Influenza vaccine given after consent obtained.  Counseled on the pneumococcal vaccine.  Pneumococcal vaccine Prevnar 20 given after consent obtained.    Screening for cancer: Colon cancer screening: Plan for repeat colonoscopy 2026  We discussed PSA, prostate exam, and prostate cancer screening risks/benefits.     Skin cancer screening: Check your skin regularly for new changes, growing lesions, or other lesions of concern Come in for  evaluation if you have skin lesions of concern.  Lung cancer screening: If you have a greater than 20 pack year history of tobacco use, then you may qualify for lung cancer screening with a chest CT scan.   Please call your insurance company to inquire about coverage for this test.  We currently don't have screenings for other cancers besides breast, cervical, colon, and lung cancers.  If you have a strong family history of cancer or have other cancer screening concerns, please let me know.    Bone health: Get at least 150 minutes of aerobic exercise weekly Get weight bearing exercise at least once weekly Bone density test:  A bone density test is an imaging test that uses a type of X-ray to measure the amount of calcium  and other minerals in your bones. The test may be used to diagnose or screen you for a condition that causes weak or thin bones (osteoporosis), predict your risk for a broken bone (fracture), or determine how well your osteoporosis treatment is working. The bone density test is recommended for females 65 and older, or females or males <65 if certain risk factors such as thyroid disease, long term use of steroids such as for asthma or rheumatological issues, vitamin D  deficiency, estrogen deficiency, family history of osteoporosis, self or family history of fragility fracture in first degree relative.    Heart health: Get at least 150 minutes of aerobic exercise weekly Limit alcohol It is important to maintain a healthy blood pressure and healthy cholesterol numbers  Heart disease screening: Screening for heart disease includes screening for blood pressure, fasting lipids, glucose/diabetes screening, BMI height to weight ratio, reviewed of smoking status, physical activity, and diet.    Goals include blood pressure 120/80 or less, maintaining a healthy lipid/cholesterol profile, preventing diabetes or keeping diabetes numbers under good control, not smoking or using tobacco  products, exercising most days per week or at least 150 minutes per week of exercise, and eating healthy variety of fruits and vegetables, healthy oils, and avoiding unhealthy food choices like fried food, fast food, high sugar and high cholesterol foods.    CT coronary result 06/15/2021 IMPRESSION: Coronary calcium  score of 226 is at the 93rd percentile for the patient's age, sex and race.    Medical care options: I recommend you continue to seek care here first for routine care.  We try really hard to have available appointments Monday through Friday daytime hours for sick visits, acute visits, and physicals.  Urgent care should be used for after hours and weekends for significant issues that cannot wait till the next day.  The emergency department should be used  for significant potentially life-threatening emergencies.  The emergency department is expensive, can often have long wait times for less significant concerns, so try to utilize primary care, urgent care, or telemedicine when possible to avoid unnecessary trips to the emergency department.  Virtual visits and telemedicine have been introduced since the pandemic started in 2020, and can be convenient ways to receive medical care.  We offer virtual appointments as well to assist you in a variety of options to seek medical care.   Separate significant issues discussed:  Hypertension-continue current medication amlodipine -olmesartan  5/20 mg daily  Hyperlipidemia-continue current medication aspirin  81 mg daily and Crestor  10 mg daily, routine labs today  BPH-no complaint  Impaired glucose-routine labs today  History lymphoma/sezary disease, mycosis fungoides lymphoma-continue routine follow-up with dermatology and treatment per dermatology  History of TIA- continue current therapy   Hayden Huang was seen today for annual exam.  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

## 2024-06-03 ENCOUNTER — Ambulatory Visit: Payer: Self-pay | Admitting: Medical

## 2024-06-03 ENCOUNTER — Other Ambulatory Visit: Payer: Self-pay | Admitting: Internal Medicine

## 2024-06-03 ENCOUNTER — Other Ambulatory Visit: Payer: Self-pay | Admitting: Medical

## 2024-06-03 DIAGNOSIS — C8405 Mycosis fungoides, lymph nodes of inguinal region and lower limb: Secondary | ICD-10-CM

## 2024-06-03 LAB — COMPREHENSIVE METABOLIC PANEL WITH GFR
ALT: 18 IU/L (ref 0–44)
AST: 17 IU/L (ref 0–40)
Albumin: 4.6 g/dL (ref 3.9–4.9)
Alkaline Phosphatase: 63 IU/L (ref 47–123)
BUN/Creatinine Ratio: 11 (ref 10–24)
BUN: 11 mg/dL (ref 8–27)
Bilirubin Total: 0.4 mg/dL (ref 0.0–1.2)
CO2: 22 mmol/L (ref 20–29)
Calcium: 9.2 mg/dL (ref 8.6–10.2)
Chloride: 104 mmol/L (ref 96–106)
Creatinine, Ser: 1.04 mg/dL (ref 0.76–1.27)
Globulin, Total: 2.2 g/dL (ref 1.5–4.5)
Glucose: 101 mg/dL — ABNORMAL HIGH (ref 70–99)
Potassium: 4.2 mmol/L (ref 3.5–5.2)
Sodium: 139 mmol/L (ref 134–144)
Total Protein: 6.8 g/dL (ref 6.0–8.5)
eGFR: 81 mL/min/1.73 (ref >59)

## 2024-06-03 LAB — URINALYSIS, ROUTINE W REFLEX MICROSCOPIC
Bilirubin, UA: NEGATIVE
Glucose, UA: NEGATIVE
Ketones, UA: NEGATIVE
Leukocytes,UA: NEGATIVE
Nitrite, UA: NEGATIVE
Protein,UA: NEGATIVE
RBC, UA: NEGATIVE
Specific Gravity, UA: 1.012 (ref 1.005–1.030)
Urobilinogen, Ur: 0.2 mg/dL (ref 0.2–1.0)
pH, UA: 8 — ABNORMAL HIGH (ref 5.0–7.5)

## 2024-06-03 LAB — CBC
Hematocrit: 45.6 % (ref 37.5–51.0)
Hemoglobin: 15.5 g/dL (ref 13.0–17.7)
MCH: 32.7 pg (ref 26.6–33.0)
MCHC: 34 g/dL (ref 31.5–35.7)
MCV: 96 fL (ref 79–97)
Platelets: 202 x10E3/uL (ref 150–450)
RBC: 4.74 x10E6/uL (ref 4.14–5.80)
RDW: 13.5 % (ref 11.6–15.4)
WBC: 4.9 x10E3/uL (ref 3.4–10.8)

## 2024-06-03 LAB — LIPID PANEL
Chol/HDL Ratio: 3.1 ratio (ref 0.0–5.0)
Cholesterol, Total: 146 mg/dL (ref 100–199)
HDL: 47 mg/dL (ref >39)
LDL Chol Calc (NIH): 87 mg/dL (ref 0–99)
Triglycerides: 60 mg/dL (ref 0–149)
VLDL Cholesterol Cal: 12 mg/dL (ref 5–40)

## 2024-06-03 LAB — PSA: Prostate Specific Ag, Serum: 0.5 ng/mL (ref 0.0–4.0)

## 2024-06-03 LAB — HEMOGLOBIN A1C
Est. average glucose Bld gHb Est-mCnc: 123 mg/dL
Hgb A1c MFr Bld: 5.9 % — ABNORMAL HIGH (ref 4.8–5.6)

## 2024-06-03 MED ORDER — ROSUVASTATIN CALCIUM 20 MG PO TABS: 20.0000 mg | ORAL_TABLET | Freq: Every day | ORAL | 3 refills | Status: AC

## 2024-06-03 MED ORDER — AMLODIPINE-OLMESARTAN 5-20 MG PO TABS: 1.0000 | ORAL_TABLET | Freq: Every day | ORAL | 3 refills | Status: AC

## 2024-06-03 MED ORDER — VALCHLOR 0.016 % EX GEL: 1.0000 | Freq: Every day | CUTANEOUS | 0 refills | Status: DC

## 2024-06-03 MED ORDER — ASPIRIN 81 MG PO TBEC: 81.0000 mg | DELAYED_RELEASE_TABLET | Freq: Every day | ORAL | 3 refills | Status: AC

## 2024-06-03 NOTE — Progress Notes (Signed)
 Results through MyChart

## 2024-06-04 ENCOUNTER — Telehealth: Payer: Self-pay | Admitting: Internal Medicine

## 2024-06-04 MED ORDER — VALCHLOR 0.016 % EX GEL: 1.0000 | Freq: Every day | CUTANEOUS | 0 refills | Status: DC

## 2024-06-04 NOTE — Telephone Encounter (Signed)
 [facility] can not handle Valchlor  Gel as it would be a speciality medication and not in contract with [facility].  I have resent in medication to [facility] that is in network with patient's insurance.

## 2024-06-07 NOTE — Telephone Encounter (Signed)
 PA Approved through 06/07/25- Will require clinical documentation of therapy management for future PA requests.

## 2024-06-07 NOTE — Telephone Encounter (Signed)
 PA is needed.  KEY in covermymeds is- [id number]   Copied from CRM #[id number]. Topic: General - Other >> Jun 07, 2024  1:40 PM Charlet HERO wrote: Reason for CRM: Lucie from [facility] calling bck to give the  number [phone] for the esclusive team for the gel.

## 2024-06-09 ENCOUNTER — Other Ambulatory Visit (HOSPITAL_COMMUNITY): Payer: Self-pay

## 2024-06-09 ENCOUNTER — Telehealth: Payer: Self-pay | Admitting: Pharmacy Technician

## 2024-06-09 NOTE — Telephone Encounter (Signed)
 Pharmacy Patient Advocate Encounter  Received notification from [facility] that Prior Authorization for Valchlor  0.016% gel has been APPROVED from 06/07/2024 to 06/07/2025.

## 2024-06-28 ENCOUNTER — Other Ambulatory Visit: Payer: Self-pay | Admitting: Medical

## 2024-09-20 ENCOUNTER — Other Ambulatory Visit: Payer: Self-pay | Admitting: Medical

## 2025-06-07 ENCOUNTER — Encounter: Payer: Self-pay | Admitting: Medical
